# Patient Record
Sex: Female | Born: 1950
Health system: Southern US, Community
[De-identification: ages and names within clinical notes are randomized; demographics above are authoritative.]

## PROBLEM LIST (undated history)

## (undated) DIAGNOSIS — I499 Cardiac arrhythmia, unspecified: Secondary | ICD-10-CM

## (undated) DIAGNOSIS — Z87442 Personal history of urinary calculi: Secondary | ICD-10-CM

## (undated) DIAGNOSIS — I4891 Unspecified atrial fibrillation: Secondary | ICD-10-CM

## (undated) DIAGNOSIS — C50919 Malignant neoplasm of unspecified site of unspecified female breast: Secondary | ICD-10-CM

## (undated) DIAGNOSIS — Z9221 Personal history of antineoplastic chemotherapy: Secondary | ICD-10-CM

## (undated) DIAGNOSIS — K429 Umbilical hernia without obstruction or gangrene: Secondary | ICD-10-CM

## (undated) HISTORY — PX: OTHER SURGICAL HISTORY: SHX169

## (undated) HISTORY — PX: OVARIAN CYST REMOVAL: SHX89

## (undated) HISTORY — DX: Unspecified atrial fibrillation: I48.91

---

## 1998-05-09 ENCOUNTER — Other Ambulatory Visit: Admission: RE | Admit: 1998-05-09 | Discharge: 1998-05-09 | Payer: Self-pay | Admitting: Obstetrics and Gynecology

## 1998-07-19 DIAGNOSIS — Z9221 Personal history of antineoplastic chemotherapy: Secondary | ICD-10-CM

## 1998-07-19 DIAGNOSIS — C50919 Malignant neoplasm of unspecified site of unspecified female breast: Secondary | ICD-10-CM

## 1998-07-19 HISTORY — DX: Malignant neoplasm of unspecified site of unspecified female breast: C50.919

## 1998-07-19 HISTORY — DX: Personal history of antineoplastic chemotherapy: Z92.21

## 1998-07-19 HISTORY — PX: MASTECTOMY: SHX3

## 1999-05-11 ENCOUNTER — Encounter (INDEPENDENT_AMBULATORY_CARE_PROVIDER_SITE_OTHER): Payer: Self-pay | Admitting: Specialist

## 1999-05-11 ENCOUNTER — Other Ambulatory Visit: Admission: RE | Admit: 1999-05-11 | Discharge: 1999-05-11 | Payer: Self-pay | Admitting: Obstetrics and Gynecology

## 1999-06-25 ENCOUNTER — Encounter: Payer: Self-pay | Admitting: Surgery

## 1999-06-25 ENCOUNTER — Encounter (INDEPENDENT_AMBULATORY_CARE_PROVIDER_SITE_OTHER): Payer: Self-pay

## 1999-06-25 ENCOUNTER — Ambulatory Visit (HOSPITAL_COMMUNITY): Admission: RE | Admit: 1999-06-25 | Discharge: 1999-06-25 | Payer: Self-pay | Admitting: Surgery

## 1999-07-10 ENCOUNTER — Encounter: Payer: Self-pay | Admitting: Obstetrics and Gynecology

## 1999-07-10 ENCOUNTER — Encounter: Admission: RE | Admit: 1999-07-10 | Discharge: 1999-07-10 | Payer: Self-pay | Admitting: Obstetrics and Gynecology

## 1999-07-21 ENCOUNTER — Encounter: Admission: RE | Admit: 1999-07-21 | Discharge: 1999-07-21 | Payer: Self-pay | Admitting: Obstetrics and Gynecology

## 1999-07-21 ENCOUNTER — Encounter: Payer: Self-pay | Admitting: Obstetrics and Gynecology

## 1999-10-14 ENCOUNTER — Encounter: Payer: Self-pay | Admitting: Surgery

## 1999-10-14 ENCOUNTER — Ambulatory Visit (HOSPITAL_COMMUNITY): Admission: RE | Admit: 1999-10-14 | Discharge: 1999-10-14 | Payer: Self-pay | Admitting: Surgery

## 2000-02-15 ENCOUNTER — Ambulatory Visit (HOSPITAL_COMMUNITY): Admission: RE | Admit: 2000-02-15 | Discharge: 2000-02-15 | Payer: Self-pay

## 2000-03-03 ENCOUNTER — Encounter (INDEPENDENT_AMBULATORY_CARE_PROVIDER_SITE_OTHER): Payer: Self-pay

## 2000-03-03 ENCOUNTER — Other Ambulatory Visit: Admission: RE | Admit: 2000-03-03 | Discharge: 2000-03-03 | Payer: Self-pay | Admitting: Obstetrics and Gynecology

## 2000-06-13 ENCOUNTER — Ambulatory Visit (HOSPITAL_COMMUNITY): Admission: RE | Admit: 2000-06-13 | Discharge: 2000-06-13 | Payer: Self-pay | Admitting: Obstetrics and Gynecology

## 2000-06-13 ENCOUNTER — Encounter: Payer: Self-pay | Admitting: Obstetrics and Gynecology

## 2000-09-07 ENCOUNTER — Encounter (INDEPENDENT_AMBULATORY_CARE_PROVIDER_SITE_OTHER): Payer: Self-pay

## 2000-09-07 ENCOUNTER — Other Ambulatory Visit: Admission: RE | Admit: 2000-09-07 | Discharge: 2000-09-07 | Payer: Self-pay | Admitting: Obstetrics and Gynecology

## 2002-06-11 ENCOUNTER — Ambulatory Visit (HOSPITAL_COMMUNITY): Admission: RE | Admit: 2002-06-11 | Discharge: 2002-06-11 | Payer: Self-pay | Admitting: Obstetrics and Gynecology

## 2002-06-11 ENCOUNTER — Encounter: Payer: Self-pay | Admitting: Obstetrics and Gynecology

## 2002-08-30 ENCOUNTER — Ambulatory Visit (HOSPITAL_COMMUNITY): Admission: RE | Admit: 2002-08-30 | Discharge: 2002-08-30 | Payer: Self-pay | Admitting: Obstetrics and Gynecology

## 2002-08-30 ENCOUNTER — Encounter: Payer: Self-pay | Admitting: Obstetrics and Gynecology

## 2004-06-29 ENCOUNTER — Other Ambulatory Visit: Admission: RE | Admit: 2004-06-29 | Discharge: 2004-06-29 | Payer: Self-pay | Admitting: Obstetrics and Gynecology

## 2005-08-09 ENCOUNTER — Other Ambulatory Visit: Admission: RE | Admit: 2005-08-09 | Discharge: 2005-08-09 | Payer: Self-pay | Admitting: Obstetrics and Gynecology

## 2005-08-12 ENCOUNTER — Ambulatory Visit (HOSPITAL_COMMUNITY): Admission: RE | Admit: 2005-08-12 | Discharge: 2005-08-12 | Payer: Self-pay | Admitting: Obstetrics and Gynecology

## 2006-08-10 ENCOUNTER — Other Ambulatory Visit: Admission: RE | Admit: 2006-08-10 | Discharge: 2006-08-10 | Payer: Self-pay | Admitting: Obstetrics and Gynecology

## 2006-11-16 ENCOUNTER — Encounter: Admission: RE | Admit: 2006-11-16 | Discharge: 2006-11-16 | Payer: Self-pay | Admitting: Obstetrics and Gynecology

## 2007-08-14 ENCOUNTER — Other Ambulatory Visit: Admission: RE | Admit: 2007-08-14 | Discharge: 2007-08-14 | Payer: Self-pay | Admitting: Obstetrics and Gynecology

## 2008-05-06 ENCOUNTER — Ambulatory Visit: Payer: Self-pay | Admitting: *Deleted

## 2008-05-06 ENCOUNTER — Ambulatory Visit: Admission: RE | Admit: 2008-05-06 | Discharge: 2008-05-06 | Payer: Self-pay | Admitting: Family Medicine

## 2008-05-06 ENCOUNTER — Encounter (INDEPENDENT_AMBULATORY_CARE_PROVIDER_SITE_OTHER): Payer: Self-pay | Admitting: Family Medicine

## 2008-08-15 ENCOUNTER — Other Ambulatory Visit: Admission: RE | Admit: 2008-08-15 | Discharge: 2008-08-15 | Payer: Self-pay | Admitting: Obstetrics and Gynecology

## 2009-08-18 ENCOUNTER — Other Ambulatory Visit: Admission: RE | Admit: 2009-08-18 | Discharge: 2009-08-18 | Payer: Self-pay | Admitting: Obstetrics and Gynecology

## 2010-08-18 ENCOUNTER — Other Ambulatory Visit (HOSPITAL_COMMUNITY)
Admission: RE | Admit: 2010-08-18 | Discharge: 2010-08-18 | Disposition: A | Discharge status: Home / Self Care | Payer: 59 | Source: Ambulatory Visit | Attending: Obstetrics and Gynecology | Admitting: Obstetrics and Gynecology

## 2010-08-18 ENCOUNTER — Other Ambulatory Visit: Payer: Self-pay | Admitting: Nurse Practitioner

## 2010-08-18 DIAGNOSIS — Z1159 Encounter for screening for other viral diseases: Secondary | ICD-10-CM | POA: Insufficient documentation

## 2010-08-18 DIAGNOSIS — Z01419 Encounter for gynecological examination (general) (routine) without abnormal findings: Secondary | ICD-10-CM | POA: Insufficient documentation

## 2012-07-19 HISTORY — PX: BREAST BIOPSY: SHX20

## 2012-08-17 ENCOUNTER — Other Ambulatory Visit: Payer: Self-pay | Admitting: Obstetrics and Gynecology

## 2012-08-17 DIAGNOSIS — Z1231 Encounter for screening mammogram for malignant neoplasm of breast: Secondary | ICD-10-CM

## 2012-08-17 DIAGNOSIS — Z9012 Acquired absence of left breast and nipple: Secondary | ICD-10-CM

## 2012-08-17 DIAGNOSIS — Z853 Personal history of malignant neoplasm of breast: Secondary | ICD-10-CM

## 2012-09-19 ENCOUNTER — Ambulatory Visit: Payer: 59

## 2012-10-11 ENCOUNTER — Ambulatory Visit: Payer: 59

## 2012-11-07 ENCOUNTER — Ambulatory Visit
Admission: RE | Admit: 2012-11-07 | Discharge: 2012-11-07 | Disposition: A | Discharge status: Home / Self Care | Payer: 59 | Source: Ambulatory Visit | Attending: Obstetrics and Gynecology | Admitting: Obstetrics and Gynecology

## 2012-11-07 ENCOUNTER — Other Ambulatory Visit: Payer: Self-pay

## 2012-11-07 DIAGNOSIS — Z9012 Acquired absence of left breast and nipple: Secondary | ICD-10-CM

## 2012-11-07 DIAGNOSIS — Z1231 Encounter for screening mammogram for malignant neoplasm of breast: Secondary | ICD-10-CM

## 2012-11-07 DIAGNOSIS — Z853 Personal history of malignant neoplasm of breast: Secondary | ICD-10-CM

## 2012-11-08 ENCOUNTER — Other Ambulatory Visit: Payer: Self-pay | Admitting: Nurse Practitioner

## 2012-11-08 DIAGNOSIS — R928 Other abnormal and inconclusive findings on diagnostic imaging of breast: Secondary | ICD-10-CM

## 2012-11-10 ENCOUNTER — Ambulatory Visit
Admission: RE | Admit: 2012-11-10 | Discharge: 2012-11-10 | Disposition: A | Discharge status: Home / Self Care | Payer: 59 | Source: Ambulatory Visit | Attending: Nurse Practitioner | Admitting: Nurse Practitioner

## 2012-11-10 ENCOUNTER — Other Ambulatory Visit: Payer: Self-pay | Admitting: Nurse Practitioner

## 2012-11-10 DIAGNOSIS — R928 Other abnormal and inconclusive findings on diagnostic imaging of breast: Secondary | ICD-10-CM

## 2012-11-13 ENCOUNTER — Ambulatory Visit
Admission: RE | Admit: 2012-11-13 | Discharge: 2012-11-13 | Disposition: A | Discharge status: Home / Self Care | Payer: 59 | Source: Ambulatory Visit | Attending: Nurse Practitioner | Admitting: Nurse Practitioner

## 2012-11-13 ENCOUNTER — Other Ambulatory Visit (HOSPITAL_COMMUNITY): Payer: Self-pay | Admitting: Diagnostic Radiology

## 2012-11-13 DIAGNOSIS — R928 Other abnormal and inconclusive findings on diagnostic imaging of breast: Secondary | ICD-10-CM

## 2012-11-14 ENCOUNTER — Other Ambulatory Visit: Payer: Self-pay | Admitting: Nurse Practitioner

## 2012-11-14 ENCOUNTER — Other Ambulatory Visit (HOSPITAL_COMMUNITY)
Admission: RE | Admit: 2012-11-14 | Discharge: 2012-11-14 | Disposition: A | Discharge status: Home / Self Care | Payer: 59 | Source: Ambulatory Visit | Attending: Obstetrics and Gynecology | Admitting: Obstetrics and Gynecology

## 2012-11-14 DIAGNOSIS — Z1151 Encounter for screening for human papillomavirus (HPV): Secondary | ICD-10-CM | POA: Insufficient documentation

## 2012-11-14 DIAGNOSIS — Z01419 Encounter for gynecological examination (general) (routine) without abnormal findings: Secondary | ICD-10-CM | POA: Insufficient documentation

## 2013-11-07 ENCOUNTER — Other Ambulatory Visit: Payer: Self-pay

## 2013-11-07 DIAGNOSIS — Z9012 Acquired absence of left breast and nipple: Secondary | ICD-10-CM

## 2013-11-07 DIAGNOSIS — Z1231 Encounter for screening mammogram for malignant neoplasm of breast: Secondary | ICD-10-CM

## 2013-11-08 ENCOUNTER — Encounter (INDEPENDENT_AMBULATORY_CARE_PROVIDER_SITE_OTHER): Payer: Self-pay

## 2013-11-08 ENCOUNTER — Ambulatory Visit
Admission: RE | Admit: 2013-11-08 | Discharge: 2013-11-08 | Disposition: A | Discharge status: Home / Self Care | Payer: Self-pay | Source: Ambulatory Visit

## 2013-11-08 DIAGNOSIS — Z1231 Encounter for screening mammogram for malignant neoplasm of breast: Secondary | ICD-10-CM

## 2013-11-08 DIAGNOSIS — Z9012 Acquired absence of left breast and nipple: Secondary | ICD-10-CM

## 2013-11-12 ENCOUNTER — Ambulatory Visit: Payer: 59

## 2014-11-08 ENCOUNTER — Other Ambulatory Visit: Payer: Self-pay | Admitting: Nurse Practitioner

## 2014-11-08 ENCOUNTER — Other Ambulatory Visit: Payer: Self-pay

## 2014-11-08 DIAGNOSIS — Z9012 Acquired absence of left breast and nipple: Secondary | ICD-10-CM

## 2014-11-08 DIAGNOSIS — Z1231 Encounter for screening mammogram for malignant neoplasm of breast: Secondary | ICD-10-CM

## 2014-11-19 ENCOUNTER — Encounter (INDEPENDENT_AMBULATORY_CARE_PROVIDER_SITE_OTHER): Payer: Self-pay

## 2014-11-19 ENCOUNTER — Ambulatory Visit
Admission: RE | Admit: 2014-11-19 | Discharge: 2014-11-19 | Disposition: A | Discharge status: Home / Self Care | Payer: 59 | Source: Ambulatory Visit

## 2014-11-19 DIAGNOSIS — Z9012 Acquired absence of left breast and nipple: Secondary | ICD-10-CM

## 2014-11-19 DIAGNOSIS — Z1231 Encounter for screening mammogram for malignant neoplasm of breast: Secondary | ICD-10-CM

## 2015-10-17 ENCOUNTER — Other Ambulatory Visit: Payer: Self-pay

## 2015-10-17 DIAGNOSIS — Z1231 Encounter for screening mammogram for malignant neoplasm of breast: Secondary | ICD-10-CM

## 2015-11-24 ENCOUNTER — Ambulatory Visit
Admission: RE | Admit: 2015-11-24 | Discharge: 2015-11-24 | Disposition: A | Discharge status: Home / Self Care | Payer: 59 | Source: Ambulatory Visit

## 2015-11-24 DIAGNOSIS — Z1231 Encounter for screening mammogram for malignant neoplasm of breast: Secondary | ICD-10-CM

## 2015-11-25 ENCOUNTER — Other Ambulatory Visit: Payer: Self-pay | Admitting: Nurse Practitioner

## 2015-11-25 ENCOUNTER — Other Ambulatory Visit (HOSPITAL_COMMUNITY)
Admission: RE | Admit: 2015-11-25 | Discharge: 2015-11-25 | Disposition: A | Discharge status: Home / Self Care | Payer: 59 | Source: Ambulatory Visit | Attending: Nurse Practitioner | Admitting: Nurse Practitioner

## 2015-11-25 DIAGNOSIS — Z01419 Encounter for gynecological examination (general) (routine) without abnormal findings: Secondary | ICD-10-CM | POA: Diagnosis present

## 2015-11-25 DIAGNOSIS — Z1151 Encounter for screening for human papillomavirus (HPV): Secondary | ICD-10-CM | POA: Insufficient documentation

## 2015-11-27 LAB — CYTOLOGY - PAP

## 2016-07-19 HISTORY — PX: BREAST BIOPSY: SHX20

## 2016-11-03 ENCOUNTER — Other Ambulatory Visit: Payer: Self-pay | Admitting: Nurse Practitioner

## 2016-11-03 DIAGNOSIS — Z1231 Encounter for screening mammogram for malignant neoplasm of breast: Secondary | ICD-10-CM

## 2016-11-24 ENCOUNTER — Ambulatory Visit
Admission: RE | Admit: 2016-11-24 | Discharge: 2016-11-24 | Disposition: A | Discharge status: Home / Self Care | Payer: Medicare Other | Source: Ambulatory Visit | Attending: Nurse Practitioner | Admitting: Nurse Practitioner

## 2016-11-24 DIAGNOSIS — Z1231 Encounter for screening mammogram for malignant neoplasm of breast: Secondary | ICD-10-CM

## 2016-11-25 ENCOUNTER — Other Ambulatory Visit: Payer: Self-pay | Admitting: Nurse Practitioner

## 2016-11-25 DIAGNOSIS — R928 Other abnormal and inconclusive findings on diagnostic imaging of breast: Secondary | ICD-10-CM

## 2016-11-29 ENCOUNTER — Other Ambulatory Visit: Payer: Self-pay | Admitting: Nurse Practitioner

## 2016-11-29 ENCOUNTER — Ambulatory Visit
Admission: RE | Admit: 2016-11-29 | Discharge: 2016-11-29 | Disposition: A | Discharge status: Home / Self Care | Payer: Medicare Other | Source: Ambulatory Visit | Attending: Nurse Practitioner | Admitting: Nurse Practitioner

## 2016-11-29 ENCOUNTER — Encounter: Payer: Self-pay | Admitting: Radiology

## 2016-11-29 DIAGNOSIS — N6310 Unspecified lump in the right breast, unspecified quadrant: Secondary | ICD-10-CM

## 2016-11-29 DIAGNOSIS — R928 Other abnormal and inconclusive findings on diagnostic imaging of breast: Secondary | ICD-10-CM

## 2016-11-30 ENCOUNTER — Other Ambulatory Visit: Payer: Self-pay | Admitting: Nurse Practitioner

## 2016-11-30 ENCOUNTER — Other Ambulatory Visit: Payer: Medicare Other

## 2016-11-30 ENCOUNTER — Ambulatory Visit
Admission: RE | Admit: 2016-11-30 | Discharge: 2016-11-30 | Disposition: A | Discharge status: Home / Self Care | Payer: Medicare Other | Source: Ambulatory Visit | Attending: Nurse Practitioner | Admitting: Nurse Practitioner

## 2016-11-30 DIAGNOSIS — N6310 Unspecified lump in the right breast, unspecified quadrant: Secondary | ICD-10-CM

## 2017-04-15 ENCOUNTER — Other Ambulatory Visit: Payer: Self-pay | Admitting: Nurse Practitioner

## 2017-04-15 DIAGNOSIS — D179 Benign lipomatous neoplasm, unspecified: Secondary | ICD-10-CM

## 2017-06-01 ENCOUNTER — Ambulatory Visit
Admission: RE | Admit: 2017-06-01 | Discharge: 2017-06-01 | Disposition: A | Discharge status: Home / Self Care | Payer: Medicare Other | Source: Ambulatory Visit | Attending: Nurse Practitioner | Admitting: Nurse Practitioner

## 2017-06-01 ENCOUNTER — Ambulatory Visit: Payer: Medicare Other

## 2017-06-01 DIAGNOSIS — D179 Benign lipomatous neoplasm, unspecified: Secondary | ICD-10-CM

## 2017-06-01 HISTORY — DX: Malignant neoplasm of unspecified site of unspecified female breast: C50.919

## 2017-06-01 HISTORY — DX: Personal history of antineoplastic chemotherapy: Z92.21

## 2017-08-18 ENCOUNTER — Other Ambulatory Visit: Payer: Self-pay | Admitting: Urology

## 2017-08-22 ENCOUNTER — Other Ambulatory Visit: Payer: Self-pay

## 2017-08-22 ENCOUNTER — Encounter (HOSPITAL_COMMUNITY): Payer: Self-pay | Admitting: *Deleted

## 2017-08-24 NOTE — H&P (Addendum)
CC: Non-obstructing stone follow-up  HPI: Nancy Ibarra is a 67 year-old female established patient who is here today for interval evaluation of non-obstructing kidney stones.  The patient was last seen one prior.   The patient has not passed any stones since they were last seen. She has had any flank pain since in the interval. The patient states the pain occurs seldom and occasionally. The patient had right renal pelvis.   The patient denies any progressive voiding symptoms. She denies dysuria. She does not have hematuria. She has not had fever and chills.   She has not had kidney stone surgery.   The patient has not completed a 24 hour urine collection in the past.   The patient underwent CT scan prior to today's appointment.   Patient is here today to follow-up a pulmonary nodule that was incidentally noted on CT scan for her hematuria evaluation.   CT Chest - 10/2015- Well-circumscribed 7x54mm ovoid nodule in right middle lobe, likely corresponding to a 5mm nodule in 2003, favored to be benign given extremely slow growth.   Chest CT-11/18: Right middle lobe pulmonary nodule 77 mm, unchanged   The patient describes no pain in the interval since she was last seen. No fevers, no hematuria, no dysuria. She describes no cough or shortness of breath.     ALLERGIES: No Allergies    MEDICATIONS: Co Q10  Fish Oil  No Reported Medications  Probiotic  Vitamin D     GU PSH: None     PSH Notes: Ovarian Surgery, Breast Surgery Mastectomy   NON-GU PSH: None   GU PMH: Renal calculus, Nephrolithiasis - 2017 Gross hematuria, Gross hematuria - 2017    NON-GU PMH: Solitary pulmonary nodule, The patient's CT scan today demonstrates this stability of her right lower lobe pulmonary nodule measuring 7 mm. - 05/24/2016 Personal history of other specified conditions, History of solitary pulmonary nodule - 2017 Breast Cancer, History, History of breast cancer - 2017 Encounter for general adult  medical examination without abnormal findings, Encounter for preventive health examination - 2017    FAMILY HISTORY: Bladder Cancer - Brother, Mother malignant neoplasm of urinary bladder - Runs In Family   SOCIAL HISTORY: Marital Status: Married Preferred Language: English; Ethnicity: Not Hispanic Or Latino; Race: White Current Smoking Status: Patient has never smoked.  Has never drank.  Does not drink caffeine. Has not had a blood transfusion. Patient's occupation is/was retired.     Notes: Occupation, Married, Alcohol use, Number of children, Never a smoker, Caffeine use   REVIEW OF SYSTEMS:    GU Review Female:   Patient denies frequent urination, hard to postpone urination, burning /pain with urination, get up at night to urinate, leakage of urine, stream starts and stops, trouble starting your stream, have to strain to urinate, and being pregnant.  Gastrointestinal (Upper):   Patient denies nausea, vomiting, and indigestion/ heartburn.  Gastrointestinal (Lower):   Patient denies diarrhea and constipation.  Constitutional:   Patient denies fever, night sweats, weight loss, and fatigue.  Skin:   Patient denies itching and skin rash/ lesion.  Eyes:   Patient denies blurred vision and double vision.  Ears/ Nose/ Throat:   Patient denies sore throat and sinus problems.  Hematologic/Lymphatic:   Patient denies swollen glands and easy bruising.  Cardiovascular:   Patient denies leg swelling and chest pains.  Respiratory:   Patient denies cough and shortness of breath.  Endocrine:   Patient denies excessive thirst.  Musculoskeletal:   Patient  denies back pain and joint pain.  Neurological:   Patient denies headaches and dizziness.  Psychologic:   Patient denies depression and anxiety.   VITAL SIGNS:      06/16/2017 02:45 PM  Weight 170 lb / 77.11 kg  BP 161/80 mmHg  Pulse 64 /min   MULTI-SYSTEM PHYSICAL EXAMINATION:    Constitutional: Obese. No physical deformities. Normally  developed. Good grooming.   Respiratory: No labored breathing, no use of accessory muscles. Clear to auscultation bilaterally  Cardiovascular: Normal temperature, normal extremity pulses, no swelling, no varicosities. Regular rate and rhythm     PAST DATA REVIEWED:  Source Of History:  Patient  X-Ray Review: C.T. Chest: Reviewed Films. Discussed With Patient.     PROCEDURES:          Urinalysis w/Scope - 81001 Dipstick Dipstick Cont'd Micro  Color: Yellow Bilirubin: Neg WBC/hpf: 0 - 5/hpf  Appearance: Clear Ketones: Neg RBC/hpf: 3 - 10/hpf  Specific Gravity: 1.020 Blood: 1+ Bacteria: NS (Not Seen)  pH: 6.5 Protein: Trace Cystals: NS (Not Seen)  Glucose: Neg Urobilinogen: 0.2 Casts: NS (Not Seen)    Nitrites: Neg Trichomonas: Not Present    Leukocyte Esterase: Neg Mucous: Present      Epithelial Cells: 0 - 5/hpf      Yeast: NS (Not Seen)      Sperm: Not Present    Notes:      ASSESSMENT:      ICD-10 Details  1 GU:   Renal calculus - N20.0    PLAN:           Orders Labs Urine Culture          Schedule Return Visit/Planned Activity: Return PRN - Office Visit          Document Letter(s):  Created for Patient: Clinical Summary         Notes:   The patient has a right nonobstructing 12 mm stone in the renal pelvis. She has had some intermittent flank pain over the past year. The stone has grown quite significantly over the past 2 years. I recommended treatment given the stone growth. We discussed 3 primary options including ureteroscopy, shockwave lithotripsy, and PCNL. I discussed all 3 of these options in great detail. Ultimately, the patient has settled on shockwave lithotripsy. She understands the associated risk of obstructing fragments, hematoma, and the need for additional procedures. However, she would like to consider her options and discuss with her husband. She will contact our office if she would like to schedule this.   A CT scan shows that the stone is  visible on the scalp image. The stone measures between 6 and 1100 Hounsfield units. It is definitely shockable.    Urine culture on 08/19/17 was negative.

## 2017-08-25 ENCOUNTER — Ambulatory Visit (HOSPITAL_COMMUNITY): Payer: Medicare Other

## 2017-08-25 ENCOUNTER — Encounter (HOSPITAL_COMMUNITY)
Admission: RE | Disposition: A | Discharge status: Home / Self Care | Payer: Self-pay | Source: Ambulatory Visit | Attending: Urology

## 2017-08-25 ENCOUNTER — Ambulatory Visit (HOSPITAL_COMMUNITY)
Admission: RE | Admit: 2017-08-25 | Discharge: 2017-08-25 | Disposition: A | Discharge status: Home / Self Care | Payer: Medicare Other | Source: Ambulatory Visit | Attending: Urology | Admitting: Urology

## 2017-08-25 ENCOUNTER — Encounter (HOSPITAL_COMMUNITY): Payer: Self-pay | Admitting: General Practice

## 2017-08-25 DIAGNOSIS — R911 Solitary pulmonary nodule: Secondary | ICD-10-CM | POA: Diagnosis not present

## 2017-08-25 DIAGNOSIS — Z853 Personal history of malignant neoplasm of breast: Secondary | ICD-10-CM | POA: Insufficient documentation

## 2017-08-25 DIAGNOSIS — N200 Calculus of kidney: Secondary | ICD-10-CM | POA: Diagnosis present

## 2017-08-25 DIAGNOSIS — Z8052 Family history of malignant neoplasm of bladder: Secondary | ICD-10-CM | POA: Diagnosis not present

## 2017-08-25 DIAGNOSIS — Z79899 Other long term (current) drug therapy: Secondary | ICD-10-CM | POA: Insufficient documentation

## 2017-08-25 HISTORY — DX: Umbilical hernia without obstruction or gangrene: K42.9

## 2017-08-25 HISTORY — DX: Personal history of urinary calculi: Z87.442

## 2017-08-25 HISTORY — PX: EXTRACORPOREAL SHOCK WAVE LITHOTRIPSY: SHX1557

## 2017-08-25 SURGERY — LITHOTRIPSY, ESWL
Anesthesia: LOCAL | Laterality: Right

## 2017-08-25 MED ORDER — OXYCODONE HCL 5 MG PO TABS: 5.0000 mg | ORAL_TABLET | ORAL | Status: DC | PRN

## 2017-08-25 MED ORDER — CIPROFLOXACIN HCL 500 MG PO TABS
500.0000 mg | ORAL_TABLET | ORAL | Status: AC
  Administered 2017-08-25: 500 mg via ORAL
  Filled 2017-08-25: qty 1

## 2017-08-25 MED ORDER — SODIUM CHLORIDE 0.9% FLUSH: 3.0000 mL | Freq: Two times a day (BID) | INTRAVENOUS | Status: DC

## 2017-08-25 MED ORDER — ACETAMINOPHEN 650 MG RE SUPP
650.0000 mg | RECTAL | Status: DC | PRN
  Filled 2017-08-25: qty 1

## 2017-08-25 MED ORDER — SODIUM CHLORIDE 0.9 % IV SOLN
INTRAVENOUS | Status: DC
  Administered 2017-08-25: 08:00:00 via INTRAVENOUS

## 2017-08-25 MED ORDER — DIAZEPAM 5 MG PO TABS
10.0000 mg | ORAL_TABLET | ORAL | Status: AC
  Administered 2017-08-25: 10 mg via ORAL
  Filled 2017-08-25: qty 2

## 2017-08-25 MED ORDER — MORPHINE SULFATE (PF) 4 MG/ML IV SOLN: 2.0000 mg | INTRAVENOUS | Status: DC | PRN

## 2017-08-25 MED ORDER — HYDROCODONE-ACETAMINOPHEN 5-325 MG PO TABS: 1.0000 | ORAL_TABLET | ORAL | 0 refills | Status: DC | PRN

## 2017-08-25 MED ORDER — DIPHENHYDRAMINE HCL 25 MG PO CAPS
25.0000 mg | ORAL_CAPSULE | ORAL | Status: AC
  Administered 2017-08-25: 25 mg via ORAL
  Filled 2017-08-25: qty 1

## 2017-08-25 MED ORDER — SODIUM CHLORIDE 0.9% FLUSH: 3.0000 mL | INTRAVENOUS | Status: DC | PRN

## 2017-08-25 MED ORDER — ACETAMINOPHEN 325 MG PO TABS: 650.0000 mg | ORAL_TABLET | ORAL | Status: DC | PRN

## 2017-08-25 MED ORDER — ONDANSETRON HCL 4 MG PO TABS: 4.0000 mg | ORAL_TABLET | Freq: Four times a day (QID) | ORAL | 1 refills | Status: DC | PRN

## 2017-08-25 MED ORDER — SODIUM CHLORIDE 0.9 % IV SOLN: 250.0000 mL | INTRAVENOUS | Status: DC | PRN

## 2017-08-25 NOTE — Discharge Instructions (Signed)
Lithotripsy, Care After °This sheet gives you information about how to care for yourself after your procedure. Your health care provider may also give you more specific instructions. If you have problems or questions, contact your health care provider. °What can I expect after the procedure? °After the procedure, it is common to have: °· Some blood in your urine. This should only last for a few days. °· Soreness in your back, sides, or upper abdomen for a few days. °· Blotches or bruises on your back where the pressure wave entered the skin. °· Pain, discomfort, or nausea when pieces (fragments) of the kidney stone move through the tube that carries urine from the kidney to the bladder (ureter). Stone fragments may pass soon after the procedure, but they may continue to pass for up to 4-8 weeks. °? If you have severe pain or nausea, contact your health care provider. This may be caused by a large stone that was not broken up, and this may mean that you need more treatment. °· Some pain or discomfort during urination. °· Some pain or discomfort in the lower abdomen or (in men) at the base of the penis. ° °Follow these instructions at home: °Medicines °· Take over-the-counter and prescription medicines only as told by your health care provider. °· If you were prescribed an antibiotic medicine, take it as told by your health care provider. Do not stop taking the antibiotic even if you start to feel better. °· Do not drive for 24 hours if you were given a medicine to help you relax (sedative). °· Do not drive or use heavy machinery while taking prescription pain medicine. °Eating and drinking °· Drink enough water and fluids to keep your urine clear or pale yellow. This helps any remaining pieces of the stone to pass. It can also help prevent new stones from forming. °· Eat plenty of fresh fruits and vegetables. °· Follow instructions from your health care provider about eating and drinking restrictions. You may be  instructed: °? To reduce how much salt (sodium) you eat or drink. Check ingredients and nutrition facts on packaged foods and beverages. °? To reduce how much meat you eat. °· Eat the recommended amount of calcium for your age and gender. Ask your health care provider how much calcium you should have. °General instructions °· Get plenty of rest. °· Most people can resume normal activities 1-2 days after the procedure. Ask your health care provider what activities are safe for you. °· If directed, strain all urine through the strainer that was provided by your health care provider. °? Keep all fragments for your health care provider to see. Any stones that are found may be sent to a medical lab for examination. The stone may be as small as a grain of salt. °· Keep all follow-up visits as told by your health care provider. This is important. °Contact a health care provider if: °· You have pain that is severe or does not get better with medicine. °· You have nausea that is severe or does not go away. °· You have blood in your urine longer than your health care provider told you to expect. °· You have more blood in your urine. °· You have pain during urination that does not go away. °· You urinate more frequently than usual and this does not go away. °· You develop a rash or any other possible signs of an allergic reaction. °Get help right away if: °· You have severe pain in   your back, sides, or upper abdomen.  You have severe pain while urinating.  Your urine is very dark red.  You have blood in your stool (feces).  You cannot pass any urine at all.  You feel a strong urge to urinate after emptying your bladder.  You have a fever or chills.  You develop shortness of breath, difficulty breathing, or chest pain.  You have severe nausea that leads to persistent vomiting.  You faint. Summary  After this procedure, it is common to have some pain, discomfort, or nausea when pieces (fragments) of the  kidney stone move through the tube that carries urine from the kidney to the bladder (ureter). If this pain or nausea is severe, however, you should contact your health care provider.  Most people can resume normal activities 1-2 days after the procedure. Ask your health care provider what activities are safe for you.  Drink enough water and fluids to keep your urine clear or pale yellow. This helps any remaining pieces of the stone to pass, and it can help prevent new stones from forming.  If directed, strain your urine and keep all fragments for your health care provider to see. Fragments or stones may be as small as a grain of salt.  Get help right away if you have severe pain in your back, sides, or upper abdomen or have severe pain while urinating. This information is not intended to replace advice given to you by your health care provider. Make sure you discuss any questions you have with your health care provider.  Your pain and nausea med prescriptions were sent to the pharmacy.  Document Released: 07/25/2007 Document Revised: 05/26/2016 Document Reviewed: 05/26/2016 Elsevier Interactive Patient Education  [employer].

## 2017-08-25 NOTE — Interval H&P Note (Signed)
History and Physical Interval Note:  No change in stone.   08/25/2017 8:54 AM  [name]  has presented today for surgery, with the diagnosis of RIGHT URETEROPELVIC JUNCTION STONE  The various methods of treatment have been discussed with the patient and family. After consideration of risks, benefits and other options for treatment, the patient has consented to  Procedure(s): RIGHT EXTRACORPOREAL SHOCK WAVE LITHOTRIPSY (ESWL) (Right) as a surgical intervention .  The patient's history has been reviewed, patient examined, no change in status, stable for surgery.  I have reviewed the patient's chart and labs.  Questions were answered to the patient's satisfaction.     Nancy Ibarra

## 2017-08-26 ENCOUNTER — Encounter (HOSPITAL_COMMUNITY): Payer: Self-pay | Admitting: Urology

## 2018-05-12 ENCOUNTER — Other Ambulatory Visit: Payer: Self-pay | Admitting: Nurse Practitioner

## 2018-05-12 DIAGNOSIS — Z1231 Encounter for screening mammogram for malignant neoplasm of breast: Secondary | ICD-10-CM

## 2018-06-20 ENCOUNTER — Ambulatory Visit
Admission: RE | Admit: 2018-06-20 | Discharge: 2018-06-20 | Disposition: A | Discharge status: Home / Self Care | Payer: Medicare Other | Source: Ambulatory Visit | Attending: Nurse Practitioner | Admitting: Nurse Practitioner

## 2018-06-20 DIAGNOSIS — Z1231 Encounter for screening mammogram for malignant neoplasm of breast: Secondary | ICD-10-CM

## 2019-05-18 ENCOUNTER — Other Ambulatory Visit: Payer: Self-pay | Admitting: Nurse Practitioner

## 2019-05-18 DIAGNOSIS — Z1231 Encounter for screening mammogram for malignant neoplasm of breast: Secondary | ICD-10-CM

## 2019-07-04 ENCOUNTER — Ambulatory Visit
Admission: RE | Admit: 2019-07-04 | Discharge: 2019-07-04 | Disposition: A | Discharge status: Home / Self Care | Payer: Medicare Other | Source: Ambulatory Visit | Attending: Nurse Practitioner | Admitting: Nurse Practitioner

## 2019-07-04 ENCOUNTER — Other Ambulatory Visit: Payer: Self-pay

## 2019-07-04 DIAGNOSIS — Z1231 Encounter for screening mammogram for malignant neoplasm of breast: Secondary | ICD-10-CM

## 2020-05-21 ENCOUNTER — Other Ambulatory Visit: Payer: Self-pay | Admitting: Family Medicine

## 2020-05-21 DIAGNOSIS — Z1231 Encounter for screening mammogram for malignant neoplasm of breast: Secondary | ICD-10-CM

## 2020-07-07 ENCOUNTER — Ambulatory Visit
Admission: RE | Admit: 2020-07-07 | Discharge: 2020-07-07 | Disposition: A | Discharge status: Home / Self Care | Payer: Medicare Other | Source: Ambulatory Visit | Attending: Family Medicine | Admitting: Family Medicine

## 2020-07-07 ENCOUNTER — Other Ambulatory Visit: Payer: Self-pay

## 2020-07-07 DIAGNOSIS — Z1231 Encounter for screening mammogram for malignant neoplasm of breast: Secondary | ICD-10-CM

## 2020-12-24 ENCOUNTER — Ambulatory Visit (INDEPENDENT_AMBULATORY_CARE_PROVIDER_SITE_OTHER): Payer: Medicare Other

## 2020-12-24 ENCOUNTER — Ambulatory Visit: Payer: Medicare Other | Admitting: Podiatry

## 2020-12-24 ENCOUNTER — Other Ambulatory Visit: Payer: Self-pay

## 2020-12-24 DIAGNOSIS — B070 Plantar wart: Secondary | ICD-10-CM | POA: Diagnosis not present

## 2020-12-24 DIAGNOSIS — M722 Plantar fascial fibromatosis: Secondary | ICD-10-CM

## 2020-12-24 MED ORDER — MELOXICAM 15 MG PO TABS: 15.0000 mg | ORAL_TABLET | Freq: Every day | ORAL | 1 refills | Status: DC

## 2020-12-24 MED ORDER — METHYLPREDNISOLONE 4 MG PO TBPK: ORAL_TABLET | ORAL | 0 refills | Status: DC

## 2020-12-24 MED ORDER — BETAMETHASONE SOD PHOS & ACET 6 (3-3) MG/ML IJ SUSP
3.0000 mg | Freq: Once | INTRAMUSCULAR | Status: AC
  Administered 2020-12-24: 11:00:00 3 mg via INTRA_ARTICULAR

## 2020-12-24 NOTE — Progress Notes (Signed)
   Subjective: 70 y.o. female presenting as a new patient for evaluation of right heel pain is been going on for few months now.  Patient denies a history of injury.  She does admit to going barefoot throughout the house.  She has not done anything for treatment.   Patient also states that she is developing symptomatic callus lesions to the weightbearing surface of the bilateral feet.  She states that they are very painful to walk on especially without shoes.  Past Medical History:  Diagnosis Date  . Breast cancer ([place]) 2000   left breast   . History of kidney stones   . Personal history of chemotherapy 2000  . Umbilical hernia      Objective: Physical Exam General: The patient is alert and oriented x3 in no acute distress.  Dermatology: Skin is warm, dry and supple bilateral lower extremities. Negative for open lesions or macerations bilateral.  Hyperkeratotic skin lesions noted to the weightbearing surfaces of the bilateral feet with pinpoint bleeding upon debridement  Vascular: Dorsalis Pedis and Posterior Tibial pulses palpable bilateral.  Capillary fill time is immediate to all digits.  Neurological: Epicritic and protective threshold intact bilateral.   Musculoskeletal: Tenderness to palpation to the plantar aspect of the right heel along the plantar fascia. All other joints range of motion within normal limits bilateral. Strength 5/5 in all groups bilateral.   Radiographic exam: Normal osseous mineralization. Joint spaces preserved. No fracture/dislocation/boney destruction. No other soft tissue abnormalities or radiopaque foreign bodies.   Assessment: 1. Plantar fasciitis right 2.  Plantar verruca benign skin lesions bilateral feet  Plan of Care:  1. Patient evaluated. Xrays reviewed.   2. Injection of 0.5cc Celestone soluspan injected into the right plantar fascia  3. Rx for Medrol Dose Pack placed 4. Rx for Meloxicam ordered for patient. 5. Plantar fascial band(s)  dispensed 6. Instructed patient regarding therapies and modalities at home to alleviate symptoms.  7.  Excisional debridement of the verruca lesions was performed using a tissue nipper followed by application of salicylic acid.  Recommend salicylic acid OTC daily  8.  Return to clinic as needed   Edrick Kins, DPM [facility]  Dr. Edrick Kins, DPM    [address] [address], [place] [address]                Office [phone]  Fax [phone]

## 2021-03-18 DIAGNOSIS — Z23 Encounter for immunization: Secondary | ICD-10-CM | POA: Diagnosis not present

## 2021-03-18 DIAGNOSIS — Q6100 Congenital renal cyst, unspecified: Secondary | ICD-10-CM | POA: Diagnosis not present

## 2021-03-18 DIAGNOSIS — Z0000 Encounter for general adult medical examination without abnormal findings: Secondary | ICD-10-CM | POA: Diagnosis not present

## 2021-03-18 DIAGNOSIS — I10 Essential (primary) hypertension: Secondary | ICD-10-CM | POA: Diagnosis not present

## 2021-03-18 DIAGNOSIS — Z853 Personal history of malignant neoplasm of breast: Secondary | ICD-10-CM | POA: Diagnosis not present

## 2021-03-18 DIAGNOSIS — E559 Vitamin D deficiency, unspecified: Secondary | ICD-10-CM | POA: Diagnosis not present

## 2021-03-18 DIAGNOSIS — M5136 Other intervertebral disc degeneration, lumbar region: Secondary | ICD-10-CM | POA: Diagnosis not present

## 2021-03-18 DIAGNOSIS — R911 Solitary pulmonary nodule: Secondary | ICD-10-CM | POA: Diagnosis not present

## 2021-03-18 DIAGNOSIS — N200 Calculus of kidney: Secondary | ICD-10-CM | POA: Diagnosis not present

## 2021-03-18 DIAGNOSIS — R7303 Prediabetes: Secondary | ICD-10-CM | POA: Diagnosis not present

## 2021-03-18 DIAGNOSIS — R011 Cardiac murmur, unspecified: Secondary | ICD-10-CM | POA: Diagnosis not present

## 2021-03-25 ENCOUNTER — Other Ambulatory Visit (HOSPITAL_COMMUNITY): Payer: Self-pay | Admitting: Family Medicine

## 2021-03-25 DIAGNOSIS — R011 Cardiac murmur, unspecified: Secondary | ICD-10-CM

## 2021-04-15 ENCOUNTER — Other Ambulatory Visit: Payer: Self-pay

## 2021-04-15 ENCOUNTER — Ambulatory Visit (HOSPITAL_COMMUNITY): Payer: Medicare Other | Attending: Family Medicine

## 2021-04-15 DIAGNOSIS — R011 Cardiac murmur, unspecified: Secondary | ICD-10-CM | POA: Insufficient documentation

## 2021-04-15 LAB — ECHOCARDIOGRAM COMPLETE
Area-P 1/2: 3.37 cm2
P 1/2 time: 405 msec
S' Lateral: 2.8 cm

## 2021-05-20 DIAGNOSIS — R011 Cardiac murmur, unspecified: Secondary | ICD-10-CM | POA: Insufficient documentation

## 2021-05-20 DIAGNOSIS — C50919 Malignant neoplasm of unspecified site of unspecified female breast: Secondary | ICD-10-CM | POA: Insufficient documentation

## 2021-05-29 DIAGNOSIS — B351 Tinea unguium: Secondary | ICD-10-CM | POA: Diagnosis not present

## 2021-05-29 DIAGNOSIS — D225 Melanocytic nevi of trunk: Secondary | ICD-10-CM | POA: Diagnosis not present

## 2021-05-29 DIAGNOSIS — L814 Other melanin hyperpigmentation: Secondary | ICD-10-CM | POA: Diagnosis not present

## 2021-05-29 DIAGNOSIS — L609 Nail disorder, unspecified: Secondary | ICD-10-CM | POA: Diagnosis not present

## 2021-05-29 DIAGNOSIS — L821 Other seborrheic keratosis: Secondary | ICD-10-CM | POA: Diagnosis not present

## 2021-06-02 ENCOUNTER — Other Ambulatory Visit: Payer: Self-pay | Admitting: Family Medicine

## 2021-06-02 ENCOUNTER — Other Ambulatory Visit: Payer: Self-pay | Admitting: Obstetrics & Gynecology

## 2021-06-02 DIAGNOSIS — Z1231 Encounter for screening mammogram for malignant neoplasm of breast: Secondary | ICD-10-CM

## 2021-06-03 DIAGNOSIS — B351 Tinea unguium: Secondary | ICD-10-CM | POA: Diagnosis not present

## 2021-07-08 ENCOUNTER — Ambulatory Visit
Admission: RE | Admit: 2021-07-08 | Discharge: 2021-07-08 | Disposition: A | Discharge status: Home / Self Care | Payer: Medicare Other | Source: Ambulatory Visit | Attending: Obstetrics & Gynecology | Admitting: Obstetrics & Gynecology

## 2021-07-08 ENCOUNTER — Other Ambulatory Visit: Payer: Self-pay | Admitting: Obstetrics & Gynecology

## 2021-07-08 DIAGNOSIS — Z1231 Encounter for screening mammogram for malignant neoplasm of breast: Secondary | ICD-10-CM

## 2022-02-04 ENCOUNTER — Other Ambulatory Visit: Payer: Self-pay | Admitting: Obstetrics & Gynecology

## 2022-02-04 DIAGNOSIS — Z1231 Encounter for screening mammogram for malignant neoplasm of breast: Secondary | ICD-10-CM

## 2022-05-22 IMAGING — MG MM DIGITAL SCREENING UNILAT*R* W/ TOMO W/ CAD
6 series · 6 of 18 positions shown · non-contrast
Comparison: Previous exam(s).

CLINICAL DATA: Screening.

EXAM:
DIGITAL SCREENING UNILATERAL RIGHT MAMMOGRAM WITH CAD AND
TOMOSYNTHESIS
TECHNIQUE: Right screening digital craniocaudal and mediolateral oblique
mammograms were obtained. Right screening digital breast
tomosynthesis was performed. The images were evaluated with
computer-aided detection.

[R MLO synth-2D (1 of 2)]
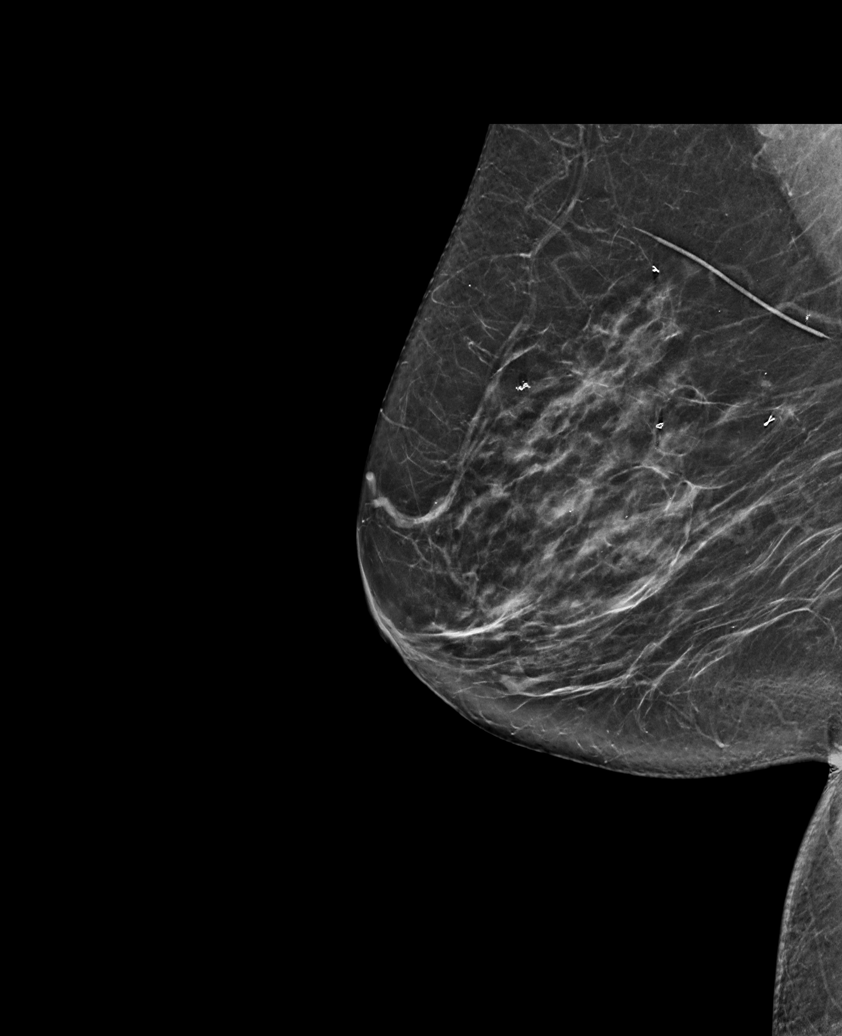

[R CC synth-2D]
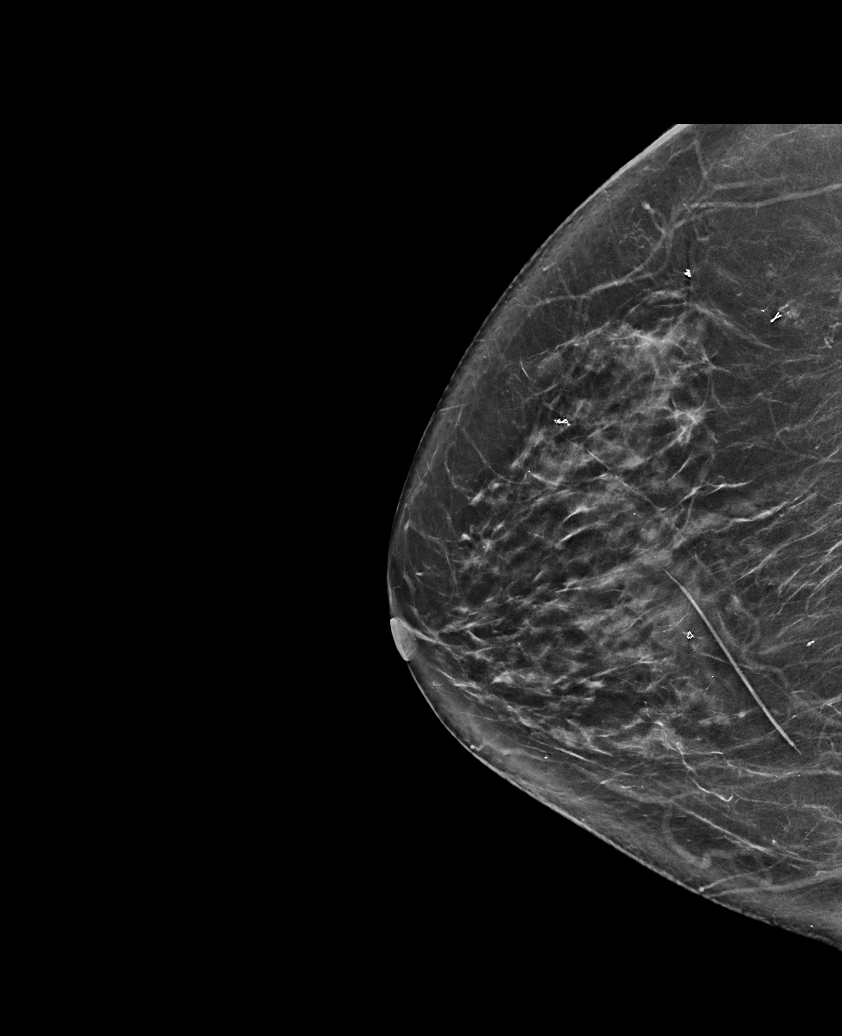

[R MLO synth-2D (2 of 2)]
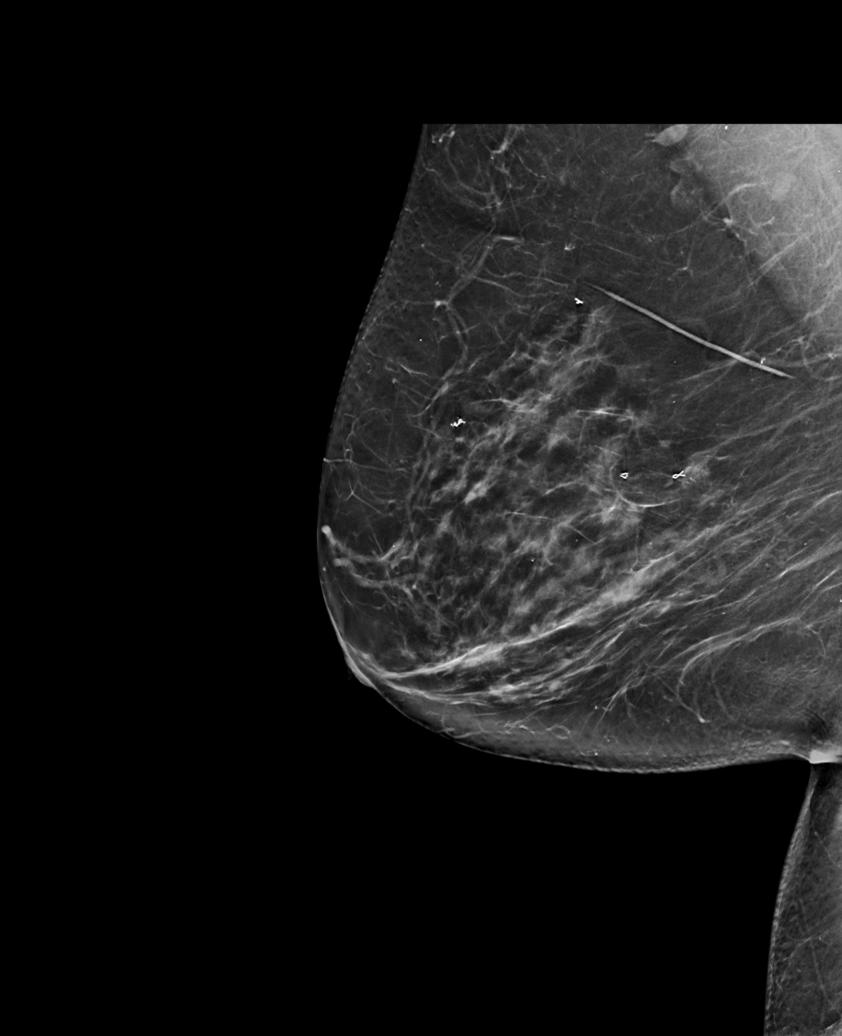

[R MLO tomo (1 of 2) · tomo slice 39/78.0]
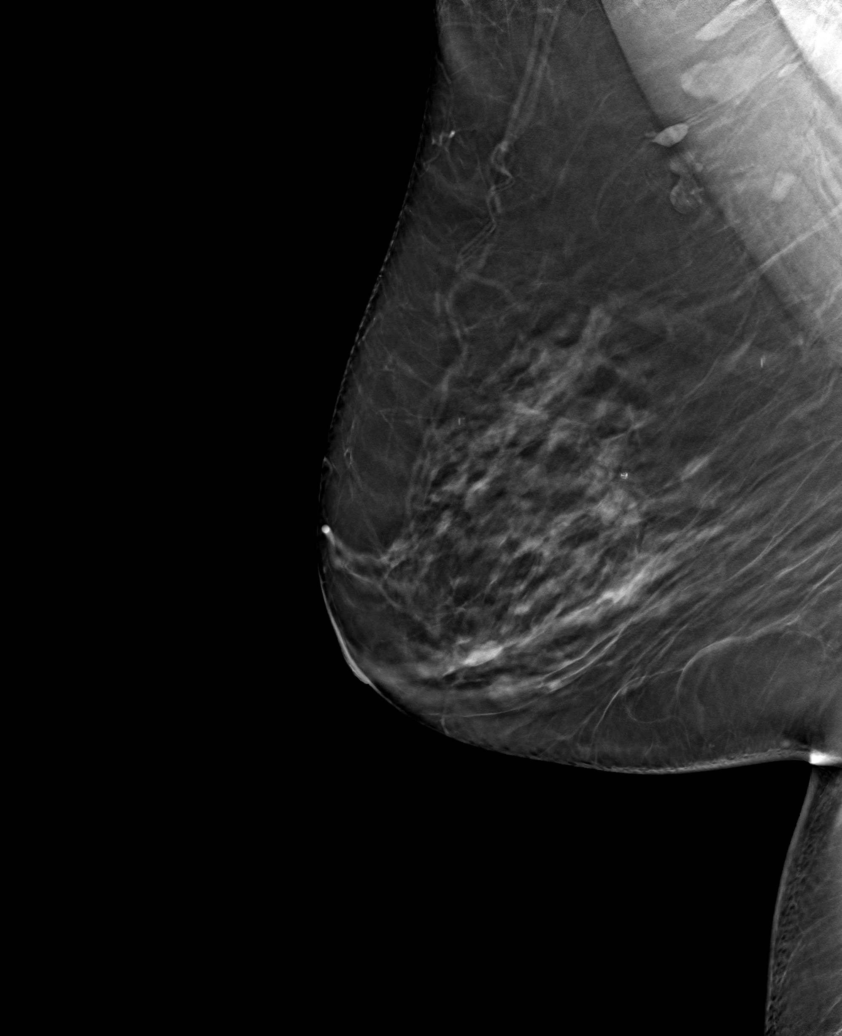

[R MLO tomo (2 of 2) · tomo slice 37/73.0]
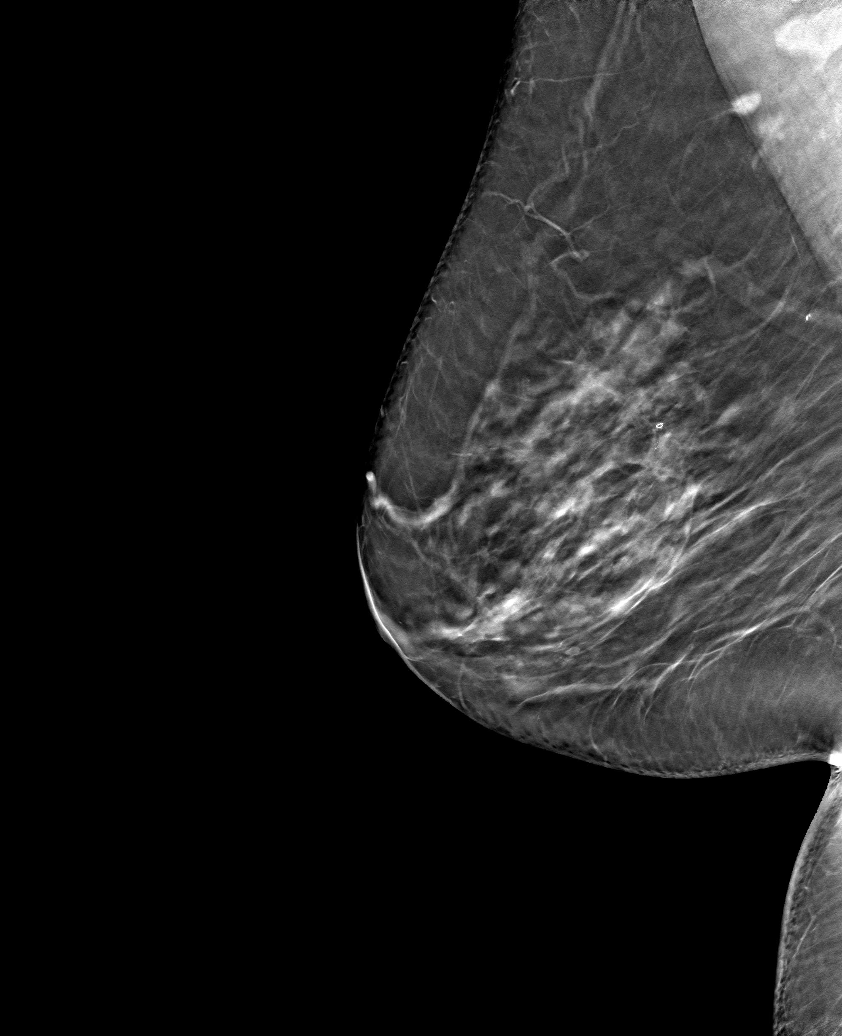

[R CC tomo · tomo slice 37/73.0]
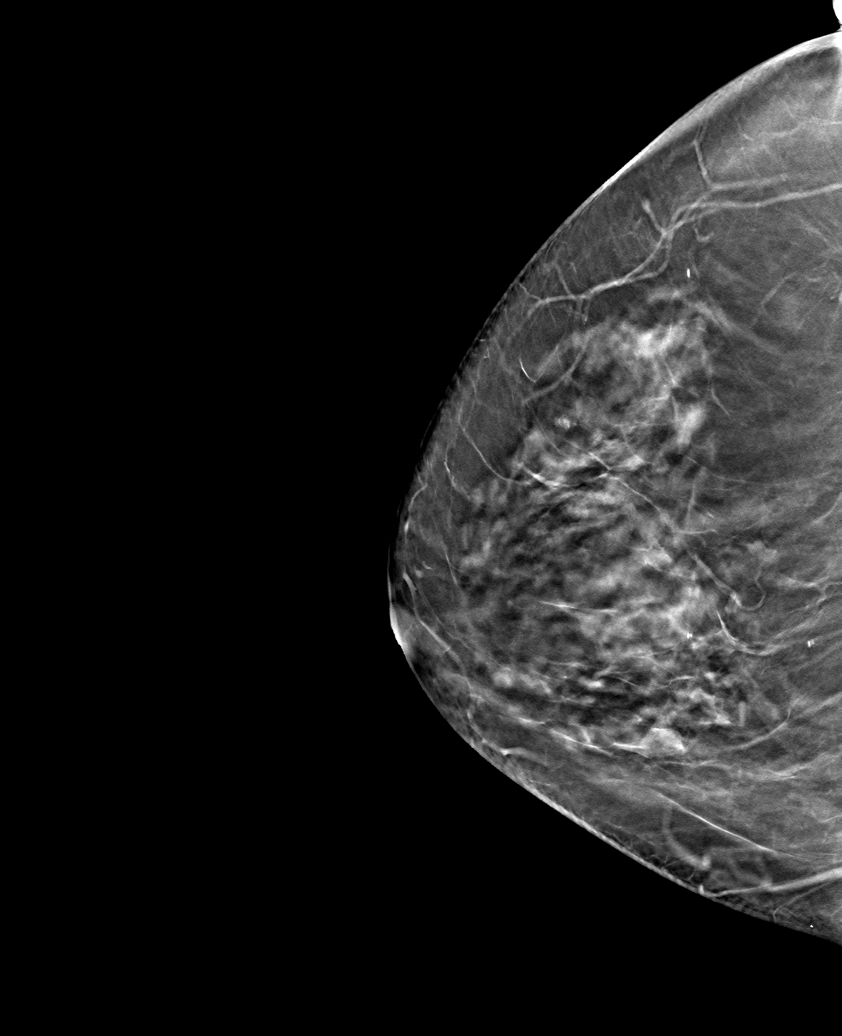

[6 of 18 positions shown; findings below may reference images not displayed]

ACR Breast Density Category c: The breast tissue is heterogeneously
dense, which may obscure small masses.
FINDINGS: The patient has had a left mastectomy. There are no findings
suspicious for malignancy.
IMPRESSION: No mammographic evidence of malignancy. A result letter of this
screening mammogram will be mailed directly to the patient.

RECOMMENDATION:
Screening mammogram in one year.  (Code:V6-Y-3H6)

BI-RADS CATEGORY  1: Negative.

## 2022-06-02 DIAGNOSIS — L80 Vitiligo: Secondary | ICD-10-CM | POA: Diagnosis not present

## 2022-06-02 DIAGNOSIS — L821 Other seborrheic keratosis: Secondary | ICD-10-CM | POA: Diagnosis not present

## 2022-06-02 DIAGNOSIS — B078 Other viral warts: Secondary | ICD-10-CM | POA: Diagnosis not present

## 2022-06-02 DIAGNOSIS — D225 Melanocytic nevi of trunk: Secondary | ICD-10-CM | POA: Diagnosis not present

## 2022-06-02 DIAGNOSIS — L814 Other melanin hyperpigmentation: Secondary | ICD-10-CM | POA: Diagnosis not present

## 2022-06-02 DIAGNOSIS — R238 Other skin changes: Secondary | ICD-10-CM | POA: Diagnosis not present

## 2022-06-23 DIAGNOSIS — R911 Solitary pulmonary nodule: Secondary | ICD-10-CM | POA: Diagnosis not present

## 2022-06-23 DIAGNOSIS — N200 Calculus of kidney: Secondary | ICD-10-CM | POA: Diagnosis not present

## 2022-06-23 DIAGNOSIS — R7303 Prediabetes: Secondary | ICD-10-CM | POA: Diagnosis not present

## 2022-06-23 DIAGNOSIS — Z79899 Other long term (current) drug therapy: Secondary | ICD-10-CM | POA: Diagnosis not present

## 2022-06-23 DIAGNOSIS — Q6100 Congenital renal cyst, unspecified: Secondary | ICD-10-CM | POA: Diagnosis not present

## 2022-06-23 DIAGNOSIS — Z0000 Encounter for general adult medical examination without abnormal findings: Secondary | ICD-10-CM | POA: Diagnosis not present

## 2022-06-23 DIAGNOSIS — R011 Cardiac murmur, unspecified: Secondary | ICD-10-CM | POA: Diagnosis not present

## 2022-06-23 DIAGNOSIS — E559 Vitamin D deficiency, unspecified: Secondary | ICD-10-CM | POA: Diagnosis not present

## 2022-06-23 DIAGNOSIS — M25562 Pain in left knee: Secondary | ICD-10-CM | POA: Diagnosis not present

## 2022-06-23 DIAGNOSIS — M5136 Other intervertebral disc degeneration, lumbar region: Secondary | ICD-10-CM | POA: Diagnosis not present

## 2022-06-23 DIAGNOSIS — I10 Essential (primary) hypertension: Secondary | ICD-10-CM | POA: Diagnosis not present

## 2022-07-26 ENCOUNTER — Ambulatory Visit
Admission: RE | Admit: 2022-07-26 | Discharge: 2022-07-26 | Disposition: A | Discharge status: Home / Self Care | Payer: Medicare Other | Source: Ambulatory Visit | Attending: Obstetrics & Gynecology | Admitting: Obstetrics & Gynecology

## 2022-07-26 DIAGNOSIS — Z1231 Encounter for screening mammogram for malignant neoplasm of breast: Secondary | ICD-10-CM

## 2022-12-16 DIAGNOSIS — R030 Elevated blood-pressure reading, without diagnosis of hypertension: Secondary | ICD-10-CM | POA: Diagnosis not present

## 2022-12-16 DIAGNOSIS — H6123 Impacted cerumen, bilateral: Secondary | ICD-10-CM | POA: Diagnosis not present

## 2022-12-21 DIAGNOSIS — H2513 Age-related nuclear cataract, bilateral: Secondary | ICD-10-CM | POA: Diagnosis not present

## 2022-12-21 DIAGNOSIS — H25013 Cortical age-related cataract, bilateral: Secondary | ICD-10-CM | POA: Diagnosis not present

## 2022-12-21 DIAGNOSIS — H25043 Posterior subcapsular polar age-related cataract, bilateral: Secondary | ICD-10-CM | POA: Diagnosis not present

## 2022-12-21 DIAGNOSIS — H2512 Age-related nuclear cataract, left eye: Secondary | ICD-10-CM | POA: Diagnosis not present

## 2022-12-21 DIAGNOSIS — H18413 Arcus senilis, bilateral: Secondary | ICD-10-CM | POA: Diagnosis not present

## 2023-03-07 DIAGNOSIS — H2511 Age-related nuclear cataract, right eye: Secondary | ICD-10-CM | POA: Diagnosis not present

## 2023-03-07 DIAGNOSIS — H2512 Age-related nuclear cataract, left eye: Secondary | ICD-10-CM | POA: Diagnosis not present

## 2023-03-07 DIAGNOSIS — H52201 Unspecified astigmatism, right eye: Secondary | ICD-10-CM | POA: Diagnosis not present

## 2023-03-08 DIAGNOSIS — H25042 Posterior subcapsular polar age-related cataract, left eye: Secondary | ICD-10-CM | POA: Diagnosis not present

## 2023-03-08 DIAGNOSIS — H25012 Cortical age-related cataract, left eye: Secondary | ICD-10-CM | POA: Diagnosis not present

## 2023-03-08 DIAGNOSIS — H2512 Age-related nuclear cataract, left eye: Secondary | ICD-10-CM | POA: Diagnosis not present

## 2023-03-28 ENCOUNTER — Encounter (HOSPITAL_COMMUNITY): Payer: Self-pay

## 2023-03-28 ENCOUNTER — Emergency Department (HOSPITAL_COMMUNITY): Payer: Medicare Other

## 2023-03-28 ENCOUNTER — Emergency Department (INDEPENDENT_AMBULATORY_CARE_PROVIDER_SITE_OTHER): Payer: Medicare Other

## 2023-03-28 ENCOUNTER — Telehealth: Payer: Self-pay | Admitting: Cardiology

## 2023-03-28 ENCOUNTER — Emergency Department (HOSPITAL_COMMUNITY)
Admission: EM | Admit: 2023-03-28 | Discharge: 2023-03-28 | Disposition: A | Discharge status: Home / Self Care | Payer: Medicare Other | Attending: Emergency Medicine | Admitting: Emergency Medicine

## 2023-03-28 ENCOUNTER — Other Ambulatory Visit: Payer: Self-pay

## 2023-03-28 DIAGNOSIS — H25042 Posterior subcapsular polar age-related cataract, left eye: Secondary | ICD-10-CM | POA: Diagnosis not present

## 2023-03-28 DIAGNOSIS — H2512 Age-related nuclear cataract, left eye: Secondary | ICD-10-CM | POA: Diagnosis not present

## 2023-03-28 DIAGNOSIS — H25012 Cortical age-related cataract, left eye: Secondary | ICD-10-CM | POA: Diagnosis not present

## 2023-03-28 DIAGNOSIS — I4891 Unspecified atrial fibrillation: Secondary | ICD-10-CM | POA: Diagnosis not present

## 2023-03-28 DIAGNOSIS — H52202 Unspecified astigmatism, left eye: Secondary | ICD-10-CM | POA: Diagnosis not present

## 2023-03-28 DIAGNOSIS — I480 Paroxysmal atrial fibrillation: Secondary | ICD-10-CM

## 2023-03-28 DIAGNOSIS — E876 Hypokalemia: Secondary | ICD-10-CM

## 2023-03-28 LAB — BASIC METABOLIC PANEL
Anion gap: 11 (ref 5–15)
BUN: 7 mg/dL — ABNORMAL LOW (ref 8–23)
CO2: 24 mmol/L (ref 22–32)
Calcium: 9.3 mg/dL (ref 8.9–10.3)
Chloride: 101 mmol/L (ref 98–111)
Creatinine, Ser: 0.66 mg/dL (ref 0.44–1.00)
GFR, Estimated: >60 mL/min (ref >60)
Glucose, Bld: 114 mg/dL — ABNORMAL HIGH (ref 70–99)
Potassium: 3 mmol/L — ABNORMAL LOW (ref 3.5–5.1)
Sodium: 136 mmol/L (ref 135–145)

## 2023-03-28 LAB — CBC WITH DIFFERENTIAL/PLATELET
Abs Immature Granulocytes: 0.03 10*3/uL (ref 0.00–0.07)
Basophils Absolute: 0 10*3/uL (ref 0.0–0.1)
Basophils Relative: 1 %
Eosinophils Absolute: 0 10*3/uL (ref 0.0–0.5)
Eosinophils Relative: 0 %
HCT: 44.4 % (ref 36.0–46.0)
Hemoglobin: 14.4 g/dL (ref 12.0–15.0)
Immature Granulocytes: 0 %
Lymphocytes Relative: 22 %
Lymphs Abs: 1.7 10*3/uL (ref 0.7–4.0)
MCH: 26.5 pg (ref 26.0–34.0)
MCHC: 32.4 g/dL (ref 30.0–36.0)
MCV: 81.6 fL (ref 80.0–100.0)
Monocytes Absolute: 0.9 10*3/uL (ref 0.1–1.0)
Monocytes Relative: 12 %
Neutro Abs: 4.9 10*3/uL (ref 1.7–7.7)
Neutrophils Relative %: 65 %
Platelets: 239 10*3/uL (ref 150–400)
RBC: 5.44 MIL/uL — ABNORMAL HIGH (ref 3.87–5.11)
RDW: 14.2 % (ref 11.5–15.5)
WBC: 7.6 10*3/uL (ref 4.0–10.5)
nRBC: 0 % (ref 0.0–0.2)

## 2023-03-28 LAB — MAGNESIUM: Magnesium: 1.9 mg/dL (ref 1.7–2.4)

## 2023-03-28 LAB — TROPONIN I (HIGH SENSITIVITY): Troponin I (High Sensitivity): 11 ng/L (ref <18)

## 2023-03-28 LAB — TSH: TSH: 0.668 u[IU]/mL (ref 0.350–4.500)

## 2023-03-28 LAB — BRAIN NATRIURETIC PEPTIDE: B Natriuretic Peptide: 136.2 pg/mL — ABNORMAL HIGH (ref 0.0–100.0)

## 2023-03-28 MED ORDER — MORPHINE SULFATE (PF) 4 MG/ML IV SOLN: 4.0000 mg | Freq: Once | INTRAVENOUS | Status: DC

## 2023-03-28 MED ORDER — METOPROLOL SUCCINATE ER 25 MG PO TB24
25.0000 mg | ORAL_TABLET | Freq: Every day | ORAL | Status: DC
  Administered 2023-03-28: 25 mg via ORAL
  Filled 2023-03-28: qty 1

## 2023-03-28 MED ORDER — DILTIAZEM LOAD VIA INFUSION
10.0000 mg | Freq: Once | INTRAVENOUS | Status: AC
  Administered 2023-03-28: 10 mg via INTRAVENOUS
  Filled 2023-03-28: qty 10

## 2023-03-28 MED ORDER — POTASSIUM CHLORIDE 10 MEQ/100ML IV SOLN
10.0000 meq | INTRAVENOUS | Status: AC
  Administered 2023-03-28: 10 meq via INTRAVENOUS
  Filled 2023-03-28: qty 100

## 2023-03-28 MED ORDER — APIXABAN 5 MG PO TABS
5.0000 mg | ORAL_TABLET | Freq: Once | ORAL | Status: AC
  Administered 2023-03-28: 5 mg via ORAL
  Filled 2023-03-28: qty 1

## 2023-03-28 MED ORDER — SODIUM CHLORIDE 0.9 % IV BOLUS
1000.0000 mL | Freq: Once | INTRAVENOUS | Status: AC
  Administered 2023-03-28: 1000 mL via INTRAVENOUS

## 2023-03-28 MED ORDER — DILTIAZEM HCL-DEXTROSE 125-5 MG/125ML-% IV SOLN (PREMIX)
5.0000 mg/h | INTRAVENOUS | Status: DC
  Administered 2023-03-28: 5 mg/h via INTRAVENOUS
  Filled 2023-03-28: qty 125

## 2023-03-28 MED ORDER — METOPROLOL SUCCINATE ER 25 MG PO TB24
25.0000 mg | ORAL_TABLET | Freq: Every day | ORAL | 0 refills | Status: DC
Start: 2023-03-28 — End: 2023-09-06

## 2023-03-28 MED ORDER — ONDANSETRON 4 MG PO TBDP: 8.0000 mg | ORAL_TABLET | Freq: Once | ORAL | Status: DC

## 2023-03-28 MED ORDER — POTASSIUM CHLORIDE ER 10 MEQ PO TBCR
20.0000 meq | EXTENDED_RELEASE_TABLET | Freq: Every day | ORAL | 0 refills | Status: DC
Start: 2023-03-28 — End: 2023-04-25

## 2023-03-28 MED ORDER — POTASSIUM CHLORIDE CRYS ER 20 MEQ PO TBCR
40.0000 meq | EXTENDED_RELEASE_TABLET | Freq: Once | ORAL | Status: AC
  Administered 2023-03-28: 40 meq via ORAL
  Filled 2023-03-28: qty 2

## 2023-03-28 MED ORDER — DILTIAZEM HCL 25 MG/5ML IV SOLN: 10.0000 mg | Freq: Once | INTRAVENOUS | Status: DC

## 2023-03-28 MED ORDER — APIXABAN 5 MG PO TABS
5.0000 mg | ORAL_TABLET | Freq: Two times a day (BID) | ORAL | 0 refills | Status: DC
Start: 2023-03-28 — End: 2023-09-06

## 2023-03-28 NOTE — Telephone Encounter (Signed)
Left the pt a message to call the office back to arrange a lab appt with our office in 1 week (BMET), per Yvonna Alanis PA-C.   Will go ahead and place the BMET order, so that when pt returns a call back to the office, triage nursing/scheduling can just arrange the one week lab appt.   She also needs an outpatient echo scheduled with our office.  A staff message was previously sent to [facility] Echo Scheduler, to call the pt back to arrange this appt.

## 2023-03-28 NOTE — Telephone Encounter (Signed)
Can we also get patient to check a BMP in a week to check her potassium?

## 2023-03-28 NOTE — Telephone Encounter (Signed)
Patient admitted for new onset atrial fibrillation.  Will plan for 2-week cardiac monitor to assess atrial fibrillation burden.  Will route this correspondence to Andee Lineman to help facilitate.  Reading MD will be Dr. Excell Seltzer.   She also needs a echocardiogram to be scheduled, please call patient to get this arranged and performed before her appointment in October with afib clinc.   She will follow up with afib clinic post ER visit then APP after that.

## 2023-03-28 NOTE — Addendum Note (Signed)
Addended by: Loa Socks on: 03/28/2023 04:49 PM   Modules accepted: Orders

## 2023-03-28 NOTE — ED Notes (Signed)
Denies any chest pain, would like to eat or drink something -- has been NPO since yesterday for eye surgery.

## 2023-03-28 NOTE — ED Provider Notes (Signed)
[place] EMERGENCY DEPARTMENT AT [facility] Provider Note   CSN: [id number] Arrival date & time: 03/28/23  0957     History  Chief Complaint  Patient presents with   Atrial Fibrillation    Nancy Ibarra is a 72 y.o. female.  72 year old female without significant past medical history presents today for concern of atrial fibrillation.  She was at the surgery center getting cataract surgery and was noted to be in A-fib.  EKG was obtained to confirm and she was sent to the emergency room for further evaluation.  No prior history of A-fib or other cardiac disease.  Not on anticoagulation.  Currently denies any complaints.  States she feels normal.  She is currently in A-fib and rates varied between 90-140.  She states her daughter is undergoing a procedure to have a mass removed from her abdomen which has given her significant amount of stress/anxiety.  She has not had any exertional dyspnea, chest discomfort, or palpitations today past week.  The history is provided by the patient. No language interpreter was used.       Home Medications Prior to Admission medications   Medication Sig Start Date End Date Taking? Authorizing Provider  HYDROcodone-acetaminophen (NORCO) 5-325 MG tablet Take 1 tablet by mouth every 4 (four) hours as needed for moderate pain. 08/25/17   Bjorn Pippin, MD  ibuprofen (ADVIL,MOTRIN) 200 MG tablet Take 200 mg by mouth every 6 (six) hours as needed.    [provider]  meloxicam (MOBIC) 15 MG tablet Take 1 tablet (15 mg total) by mouth daily. 12/24/20   Felecia Shelling, DPM  methylPREDNISolone (MEDROL DOSEPAK) 4 MG TBPK tablet 6 day dose pack - take as directed 12/24/20   Felecia Shelling, DPM  naproxen sodium (ALEVE) 220 MG tablet Take 220 mg by mouth daily as needed.    [provider]  ondansetron (ZOFRAN) 4 MG tablet Take 1 tablet (4 mg total) by mouth 4 (four) times daily as needed for nausea or vomiting. 08/25/17   Bjorn Pippin, MD       Allergies    Oxybutynin, Oxycodone, and Sulfa antibiotics    Review of Systems   Review of Systems  Constitutional:  Negative for chills and fever.  Eyes:  Negative for visual disturbance.  Respiratory:  Negative for shortness of breath.   Cardiovascular:  Negative for chest pain, palpitations and leg swelling.  Neurological:  Negative for syncope and light-headedness.  All other systems reviewed and are negative.   Physical Exam Updated Vital Signs BP 113/66 (BP Location: Right Arm)   Pulse 98   Temp 99.4 F (37.4 C) (Oral)   Resp 17   Ht 4' 10.5" (1.486 m)   Wt 81.6 kg   SpO2 95%   BMI 36.98 kg/m  Physical Exam Vitals and nursing note reviewed.  Constitutional:      General: She is not in acute distress.    Appearance: Normal appearance. She is not ill-appearing.  HENT:     Head: Normocephalic and atraumatic.     Nose: Nose normal.  Eyes:     General: No scleral icterus.    Extraocular Movements: Extraocular movements intact.     Conjunctiva/sclera: Conjunctivae normal.  Cardiovascular:     Rate and Rhythm: Tachycardia present. Rhythm irregular.     Heart sounds: Normal heart sounds.  Pulmonary:     Effort: Pulmonary effort is normal. No respiratory distress.     Breath sounds: Normal breath sounds. No  wheezing or rales.  Musculoskeletal:        General: Normal range of motion.     Cervical back: Normal range of motion.  Skin:    General: Skin is warm and dry.  Neurological:     General: No focal deficit present.     Mental Status: She is alert. Mental status is at baseline.     ED Results / Procedures / Treatments   Labs (all labs ordered are listed, but only abnormal results are displayed) Labs Reviewed  BASIC METABOLIC PANEL  CBC WITH DIFFERENTIAL/PLATELET  MAGNESIUM  TSH  BRAIN NATRIURETIC PEPTIDE  CBC WITH DIFFERENTIAL/PLATELET  TROPONIN I (HIGH SENSITIVITY)    EKG EKG Interpretation Date/Time:  Monday March 28 2023 10:23:48  EDT Ventricular Rate:  126 PR Interval:    QRS Duration:  88 QT Interval:  306 QTC Calculation: 443 R Axis:   58  Text Interpretation: Atrial fibrillation with rapid ventricular response Nonspecific ST and T wave abnormality Abnormal ECG No previous ECGs available Confirmed by Virgina Norfolk [phone]) on 03/28/2023 10:40:57 AM  Radiology No results found.  Procedures .Critical Care  Performed by: Marita Kansas, PA-C Authorized by: Marita Kansas, PA-C   Critical care provider statement:    Critical care time (minutes):  31   Critical care was necessary to treat or prevent imminent or life-threatening deterioration of the following conditions: a fib RVR.   Critical care was time spent personally by me on the following activities:  Development of treatment plan with patient or surrogate, discussions with consultants, evaluation of patient's response to treatment, examination of patient, ordering and review of laboratory studies, ordering and review of radiographic studies, ordering and performing treatments and interventions, pulse oximetry, re-evaluation of patient's condition and review of old charts     Medications Ordered in ED Medications  diltiazem (CARDIZEM) 1 mg/mL load via infusion 10 mg (has no administration in time range)    And  diltiazem (CARDIZEM) 125 mg in dextrose 5% 125 mL (1 mg/mL) infusion (has no administration in time range)  sodium chloride 0.9 % bolus 1,000 mL (has no administration in time range)    ED Course/ Medical Decision Making/ A&P                                 Medical Decision Making Amount and/or Complexity of Data Reviewed Labs: ordered. Radiology: ordered.  Risk Prescription drug management.   72 year old female who is otherwise healthy presents today in the new onset A-fib.  She is in RVR.  Asymptomatic.  Reports significant amount of stress over the past week.  Husband is at bedside.  Unsure how long she has been in A-fib.  EKG was obtained  after the surgery when she was noted to be in an irregular rhythm during the surgery.  CHA2DS2-VASc score of 2 due to age and female gender.  Will provide Dilt push and start Dilt drip.  Will obtain labs including TSH.  Hemodynamically stable.  CBC without leukocytosis or anemia.  BMP shows potassium 3.0, glucose 114 otherwise without acute concern.  Renal function is preserved.  Initial troponin 11.  Magnesium 1.9.  BNP is slightly elevated at 136 but no significant signs of volume overload on exam.  TSH within normal limit.  Chest x-ray without signs of volume overload.  No acute cardiopulmonary process.  EKG shows A-fib with RVR.  No acute ischemic changes.   On diltiazem drip  patient remains rate controlled with rates of 70-90 with occasional rates in the low 100s.  Case discussed with cardiology.  They will evaluate patient and provide further recommendations.  I also discussed the case with Dr. Vonna Kotyk' office in regards to starting patient on anticoagulation given she had procedure this morning.  From his standpoint patient can be anticoagulated if needed.   Final Clinical Impression(s) / ED Diagnoses Final diagnoses:  Atrial fibrillation with RVR [facility])    Rx / DC Orders ED Discharge Orders     None         Marita Kansas, PA-C 03/28/23 1505    Virgina Norfolk, DO 03/28/23 1604

## 2023-03-28 NOTE — ED Provider Notes (Signed)
  Physical Exam  BP (!) 124/54   Pulse 70   Temp 99.5 F (37.5 C) (Oral)   Resp 18   Ht 4' 10.5" (1.486 m)   Wt 81.6 kg   SpO2 97%   BMI 36.98 kg/m   Physical Exam Vitals and nursing note reviewed.  Constitutional:      General: She is not in acute distress.    Appearance: Normal appearance.  HENT:     Head: Normocephalic and atraumatic.     Mouth/Throat:     Mouth: Mucous membranes are moist.     Pharynx: Oropharynx is clear.  Cardiovascular:     Rate and Rhythm: Normal rate and regular rhythm.  Pulmonary:     Effort: Pulmonary effort is normal. No respiratory distress.  Neurological:     General: No focal deficit present.     Mental Status: She is alert and oriented to person, place, and time.     Procedures  Procedures  ED Course / MDM   Clinical Course as of 03/28/23 1709  Mon Mar 28, 2023  1524 New a fib. Cataract surgery this AM. Cards to see.  [KH]  1602 Metop XL 25 [KH]    Clinical Course User Index [KH] Claretha Cooper, DO   Medical Decision Making Amount and/or Complexity of Data Reviewed Labs: ordered. Radiology: ordered.  Risk Prescription drug management.   At the time my assumption of care, patient is afebrile, hemodynamically stable, in no acute distress.  She is on diltiazem drip and with this has converted to normal sinus rhythm.  Discussed with cardiology and given her conversion, recommending initiation of Eliquis and metoprolol.  First dose of both of these given in the emergency department.  Diltiazem drip discontinued and patient maintained normal sinus rhythm with appropriate rate.  All findings discussed with patient.  She states that she does desire discharge.  Cardiology will arrange outpatient repeat BMP, echo, cardiac monitor.  Return precautions including any new or worsening chest pain or shortness of breath.  Patient reports understanding agreement.  Discharged that further acute event under my care in the emergency  department.       Claretha Cooper, DO 03/29/23 0114    Melene Plan, DO 03/29/23 1402

## 2023-03-28 NOTE — Progress Notes (Unsigned)
Enrolled for [employer] to mail a ZIO XT long term holter monitor to the patients address on file.   Dr. Cooper to read. 

## 2023-03-28 NOTE — Telephone Encounter (Signed)
Staff message sent to Echo Scheduler to call the pt back to arrange echo.  Pt will see afib clinic on 10/7 at 3 pm and Jari Favre PA-C on 10/21 at 2:20 pm.   Andee Lineman monitor tech to enroll the pts zio and mail this to her mailing address.

## 2023-03-28 NOTE — Consult Note (Addendum)
Cardiology Consultation   Patient ID: Nancy Ibarra MRN: [id number]; DOB: 07/09/1951  Admit date: 03/28/2023 Date of Consult: 03/28/2023  PCP:  Mila Palmer, MD   [facility] Providers Cardiologist:  None   {   Patient Profile:   Nancy Ibarra is a 72 y.o. female with a hx of breast cancer who is being seen 03/28/2023 for the evaluation of A-fib RVR at the request of Dr. Lockie Mola.  History of Present Illness:   Nancy Ibarra has no prior cardiac history or family history.  She denies smoking, drugs, alcohol.  She does have an echocardiogram in September 2022 that noted normal EF 55 to 60% with normal RV function.  She had mild MR.  Echocardiogram was done for possible murmur.  She was never formally evaluated by our group.  Currently patient is status post cataract surgery who had EKG that showed atrial fibrillation and was sent over to the emergency room.  She initially presented with A-fib RVR with heart rates in the 120s.  On evaluation she had low potassium 3.0 with no obvious etiologies.  She denied any vomiting or diarrhea.  Does not appear to be on any medications that would contribute to this. Overall, patient without any complaints and asymptomatic from this.  She denied any prior complaints of chest pain, dizziness, shortness of breath, palpitations, syncope, fatigue, peripheral edema.  While evaluating patient at the bedside she converted back to normal sinus rhythm with heart rates in the 80s while on diltiazem drip.    Past Medical History:  Diagnosis Date   Breast cancer (HCC) 2000   left breast    History of kidney stones    Personal history of chemotherapy 2000   Umbilical hernia     Past Surgical History:  Procedure Laterality Date   BREAST BIOPSY Right 2014   fibroadenoma   BREAST BIOPSY Right 2018   angiolipoma   EXTRACORPOREAL SHOCK WAVE LITHOTRIPSY Right 08/25/2017   Procedure: RIGHT EXTRACORPOREAL SHOCK WAVE LITHOTRIPSY (ESWL);  Surgeon: Bjorn Pippin,  MD;  Location: [facility];  Service: Urology;  Laterality: Right;   MASTECTOMY Left 2000   ovarian cyst removed     age 33    Inpatient Medications: Scheduled Meds:  apixaban  5 mg Oral Once   metoprolol succinate  25 mg Oral Daily   Continuous Infusions:   PRN Meds:   Allergies:    Allergies  Allergen Reactions   Oxybutynin     Other reaction(s): rash   Oxycodone Rash   Sulfa Antibiotics Rash    Social History:   Social History   Socioeconomic History   Marital status: Married    Spouse name: Not on file   Number of children: Not on file   Years of education: Not on file   Highest education level: Not on file  Occupational History   Not on file  Tobacco Use   Smoking status: Never   Smokeless tobacco: Never  Vaping Use   Vaping status: Never Used  Substance and Sexual Activity   Alcohol use: No   Drug use: No   Sexual activity: Not on file  Other Topics Concern   Not on file  Social History Narrative   Not on file   Social Determinants of Health   Financial Resource Strain: Not on file  Food Insecurity: Not on file  Transportation Needs: Not on file  Physical Activity: Not on file  Stress: Not on file  Social Connections: Not on file  Intimate Partner Violence: Not on file    Family History:   History reviewed. No pertinent family history.   ROS:  Please see the history of present illness.   All other ROS reviewed and negative.     Physical Exam/Data:   Vitals:   03/28/23 1300 03/28/23 1523 03/28/23 1530 03/28/23 1600  BP: 109/70  111/85 (!) 124/54  Pulse: (!) 143  72 68  Resp: 20  (!) 22 18  Temp:  99.5 F (37.5 C)    TempSrc:  Oral    SpO2: 99%  99% 97%  Weight:      Height:        Intake/Output Summary (Last 24 hours) at 03/28/2023 1615 Last data filed at 03/28/2023 1304 Gross per 24 hour  Intake 1000 ml  Output --  Net 1000 ml      03/28/2023   10:31 AM 08/25/2017    6:55 AM  Last 3 Weights  Weight (lbs) 180 lb 166 lb  Weight  (kg) 81.647 kg 75.297 kg     Body mass index is 36.98 kg/m.  General:  Well nourished, well developed, in no acute distress HEENT: normal Neck: no JVD Vascular: No carotid bruits; Distal pulses 2+ bilaterally Cardiac:  normal S1, S2; RRR; no murmur  Lungs:  clear to auscultation bilaterally, no wheezing, rhonchi or rales  Abd: soft, nontender, no hepatomegaly  Ext: no edema Musculoskeletal:  No deformities, BUE and BLE strength normal and equal Skin: warm and dry  Neuro:  CNs 2-12 intact, no focal abnormalities noted Psych:  Normal affect   EKG:  The EKG was personally reviewed and demonstrates: Initial EKG showed atrial fibrillation with heart rate 126.  Repeating EKG now Telemetry:  Telemetry was personally reviewed and demonstrates: Atrial fibrillation with very uncontrolled and erratic rates in the 120s to 80s.  She had intermittent breaks of sinus beats before maintaining normal sinus rhythm.  Relevant CV Studies: Echocardiogram 04/07/2021 1. Left ventricular ejection fraction, by estimation, is 55 to 60%. The  left ventricle has normal function. The left ventricle has no regional  wall motion abnormalities. Left ventricular diastolic parameters were  normal. The average left ventricular  global longitudinal strain is -20.0 %. The global longitudinal strain is  normal.   2. Right ventricular systolic function is normal. The right ventricular  size is normal.   3. The mitral valve is normal in structure. Mild mitral valve  regurgitation. No evidence of mitral stenosis.   4. The aortic valve is tricuspid. Aortic valve regurgitation is trivial.  Mild aortic valve sclerosis is present, with no evidence of aortic valve  stenosis.   5. The inferior vena cava is normal in size with greater than 50%  respiratory variability, suggesting right atrial pressure of 3 mmHg.    Laboratory Data:  High Sensitivity Troponin:   Recent Labs  Lab 03/28/23 1101  TROPONINIHS 11      Chemistry Recent Labs  Lab 03/28/23 1101  NA 136  K 3.0*  CL 101  CO2 24  GLUCOSE 114*  BUN 7*  CREATININE 0.66  CALCIUM 9.3  MG 1.9  GFRNONAA >60  ANIONGAP 11    No results for input(s): "PROT", "ALBUMIN", "AST", "ALT", "ALKPHOS", "BILITOT" in the last 168 hours. Lipids No results for input(s): "CHOL", "TRIG", "HDL", "LABVLDL", "LDLCALC", "CHOLHDL" in the last 168 hours.  Hematology Recent Labs  Lab 03/28/23 1102  WBC 7.6  RBC 5.44*  HGB 14.4  HCT 44.4  MCV 81.6  MCH 26.5  MCHC 32.4  RDW 14.2  PLT 239   Thyroid  Recent Labs  Lab 03/28/23 1101  TSH 0.668    BNP Recent Labs  Lab 03/28/23 1102  BNP 136.2*    DDimer No results for input(s): "DDIMER" in the last 168 hours.   Radiology/Studies:  DG Chest Port 1 View  Result Date: 03/28/2023 CLINICAL DATA:  Atrial fibrillation. EXAM: PORTABLE CHEST 1 VIEW COMPARISON:  None Available. FINDINGS: Hyperinflation. No consolidation, pneumothorax or effusion. No edema. Normal cardiopericardial silhouette. Overlapping cardiac leads. Surgical clips in the left axillary region. The extreme inferior costophrenic angles are clipped off the edge of the film. IMPRESSION: Hyperinflation.  Interstitial changes. Electronically Signed   By: Karen Kays M.D.   On: 03/28/2023 11:56     Assessment and Plan:   New onset atrial fibrillation with RVR Initially presented with heart rates in the 120s unsure chronicity, but status post cataract surgery today.  Has been reporting significant stress lately however is asymptomatic from a cardiac standpoint and denies any shortness of breath, palpitations, dizziness while in rhythm.  While evaluating patient she converted back to normal sinus rhythm on Cardizem drip. Patient likely can DC.  Repeating EKG Will plan for 2-week cardiac monitor to assess A-fib burden.  Patient has voiced not wanting to be on anticoagulation. Given relatively brief episode this may be reasonable despite elevated  CHA2DS2-VASc score of 2.  Plan for monitor/DOAC (eliquis 5mg  BID) and then reassess outpatient whether or not to continue anticoagulation. Will arrange folllow up with afib clinic.   Will also start patient on Toprol-XL 25 mg. Echo will be arranged for outpatient.  Normal TSH, previously normal echocardiogram in 2022.  Hypokalemia Potassium 3.0.  No obvious etiologies of this.  Denied any diarrhea/vomiting.  This could be driving her arrhythmia.  Will DC on 20 meq daily, this probably can be purchased OTC. BMP in 1 week.   Elevated BNP 136 Likely related to RVR.  No signs of heart failure symptoms.   Risk Assessment/Risk Scores:    CHA2DS2-VASc Score = 2  This indicates a 2.2% annual risk of stroke. The patient's score is based upon: CHF History: 0 HTN History: 0 Diabetes History: 0 Stroke History: 0 Vascular Disease History: 0 Age Score: 1 Gender Score: 1     For questions or updates, please contact  [facility] Please consult [email or web address] for contact info under    Signed, Abagail Kitchens, PA-C  03/28/2023 4:15 PM   Patient seen, examined. Available data reviewed. Agree with findings, assessment, and plan as outlined by Yvonna Alanis, PA-C.  The patient is independently interviewed and examined.  Her husband is present at the bedside.  She is alert, oriented, in no distress.  HEENT is normal, lungs are clear bilaterally, JVP is normal, there are no carotid bruits, heart is regular rate and rhythm with no murmur gallop, abdomen soft nontender, extremities have no edema.  The patient's EKG and telemetry strips are reviewed.  She was in atrial fibrillation earlier today and he has converted spontaneously back to sinus rhythm on a Cardizem drip.  She had cataract surgery earlier today and also was under a great deal of stress about her 93-year-old granddaughter having surgery for a mass in her abdomen.  She has no perception of her atrial fibrillation and specifically denied any  chest pain, heart palpitations, or shortness of breath today.  She had her other eye done 3 weeks ago and presumably was in sinus  rhythm at that time.  She has never had atrial fibrillation in the past.  We discussed treatment strategies around atrial fibrillation, potential etiologies, and further testing.  Now that she is back in sinus rhythm, I think she is medically stable to discharge home from the emergency room.  She should not require hospital admission.  Will discontinue her Cardizem drip and start her on metoprolol succinate 25 mg daily to be taken for at least the next month.  Will start her on apixaban 5 mg twice daily also to be taken for 30 days minimum.  She is not keen on long-term anticoagulation.  We have recommended an outpatient echocardiogram and 2-week ZIO monitor to evaluate for any LV dysfunction or structural heart abnormality as well as evaluate her atrial for burden.  If she has no atrial fibrillation identified, can consider a limited course of anticoagulation.  Will arrange follow-up in the atrial fibrillation clinic and I will be happy to see her long-term.  Lab work is reviewed and demonstrates normal renal function, normal troponin, normal blood counts, and hypokalemia with treatment as outlined above.  TSH is also shown to be normal.  Tonny Bollman, M.D. 03/28/2023 5:27 PM

## 2023-03-28 NOTE — ED Triage Notes (Signed)
Pt came from eye surgery, pt had EKG done after surgery that showed a fib, no hx of a fib. Pt has no chest pain or palpations, pt states she is under a lot of stress today.

## 2023-03-28 NOTE — Addendum Note (Signed)
Addended byYvonna Alanis on: 03/28/2023 04:41 PM   Modules accepted: Orders

## 2023-03-30 NOTE — Telephone Encounter (Signed)
RE: NEEDS AN ECHO Received: Mervyn Skeeters, LPN Called to schedule and had to leave a voicemail.

## 2023-03-31 NOTE — Telephone Encounter (Signed)
Pts lab appt is scheduled for 9/17 and echo scheduled for 9/24.  Pt made aware of these appts by Scheduling team.  Pt will see Jari Favre PA-C on 05/09/23.

## 2023-04-01 DIAGNOSIS — Z03818 Encounter for observation for suspected exposure to other biological agents ruled out: Secondary | ICD-10-CM | POA: Diagnosis not present

## 2023-04-01 DIAGNOSIS — R310 Gross hematuria: Secondary | ICD-10-CM | POA: Diagnosis not present

## 2023-04-01 DIAGNOSIS — R059 Cough, unspecified: Secondary | ICD-10-CM | POA: Diagnosis not present

## 2023-04-03 DIAGNOSIS — I480 Paroxysmal atrial fibrillation: Secondary | ICD-10-CM | POA: Diagnosis not present

## 2023-04-05 ENCOUNTER — Ambulatory Visit: Payer: Medicare Other | Attending: Physician Assistant

## 2023-04-05 DIAGNOSIS — I480 Paroxysmal atrial fibrillation: Secondary | ICD-10-CM | POA: Diagnosis not present

## 2023-04-05 DIAGNOSIS — E876 Hypokalemia: Secondary | ICD-10-CM

## 2023-04-07 ENCOUNTER — Ambulatory Visit
Admission: RE | Admit: 2023-04-07 | Discharge: 2023-04-07 | Disposition: A | Discharge status: Home / Self Care | Payer: Medicare Other | Source: Ambulatory Visit | Attending: Family Medicine | Admitting: Family Medicine

## 2023-04-07 ENCOUNTER — Other Ambulatory Visit: Payer: Self-pay | Admitting: Family Medicine

## 2023-04-07 DIAGNOSIS — R0602 Shortness of breath: Secondary | ICD-10-CM | POA: Diagnosis not present

## 2023-04-07 DIAGNOSIS — R319 Hematuria, unspecified: Secondary | ICD-10-CM | POA: Diagnosis not present

## 2023-04-07 DIAGNOSIS — R059 Cough, unspecified: Secondary | ICD-10-CM | POA: Diagnosis not present

## 2023-04-07 DIAGNOSIS — I480 Paroxysmal atrial fibrillation: Secondary | ICD-10-CM | POA: Diagnosis not present

## 2023-04-07 DIAGNOSIS — J189 Pneumonia, unspecified organism: Secondary | ICD-10-CM

## 2023-04-07 DIAGNOSIS — N200 Calculus of kidney: Secondary | ICD-10-CM | POA: Diagnosis not present

## 2023-04-12 ENCOUNTER — Ambulatory Visit (HOSPITAL_COMMUNITY): Payer: Medicare Other | Attending: Cardiology

## 2023-04-12 DIAGNOSIS — I480 Paroxysmal atrial fibrillation: Secondary | ICD-10-CM | POA: Diagnosis not present

## 2023-04-12 LAB — ECHOCARDIOGRAM COMPLETE
Area-P 1/2: 3.05 cm2
S' Lateral: 2.7 cm

## 2023-04-22 DIAGNOSIS — I480 Paroxysmal atrial fibrillation: Secondary | ICD-10-CM | POA: Diagnosis not present

## 2023-04-25 ENCOUNTER — Encounter (HOSPITAL_COMMUNITY): Payer: Self-pay | Admitting: Physician Assistant

## 2023-04-25 ENCOUNTER — Ambulatory Visit (HOSPITAL_COMMUNITY)
Admit: 2023-04-25 | Discharge: 2023-04-25 | Disposition: A | Discharge status: Home / Self Care | Payer: Medicare Other | Attending: Physician Assistant | Admitting: Physician Assistant

## 2023-04-25 VITALS — BP 142/72 | HR 70 | Ht 58.5 in | Wt 178.2 lb

## 2023-04-25 DIAGNOSIS — Z79899 Other long term (current) drug therapy: Secondary | ICD-10-CM | POA: Insufficient documentation

## 2023-04-25 DIAGNOSIS — I480 Paroxysmal atrial fibrillation: Secondary | ICD-10-CM

## 2023-04-25 DIAGNOSIS — Z853 Personal history of malignant neoplasm of breast: Secondary | ICD-10-CM | POA: Diagnosis not present

## 2023-04-25 DIAGNOSIS — Z7901 Long term (current) use of anticoagulants: Secondary | ICD-10-CM | POA: Diagnosis not present

## 2023-04-25 MED ORDER — POTASSIUM CHLORIDE ER 10 MEQ PO TBCR: 20.0000 meq | EXTENDED_RELEASE_TABLET | Freq: Every day | ORAL | 2 refills | Status: DC

## 2023-04-25 NOTE — Progress Notes (Signed)
Primary Care Physician: Mila Palmer, MD Primary Cardiologist: Tonny Bollman, MD Electrophysiologist: None  Referring Physician: Dr Clarita Leber is a 72 y.o. female with a history of breast cancer and atrial fibrillation who presents for follow up in the [facility].  The patient was initially diagnosed with atrial fibrillation 03/28/23 after cataract surgery. She was sent to the ED for evaluation. ECG at the ED showed afib with RVR. She converted to SR with a diltiazem drip. Her K+ was 3.0 and was repleated. In hindsight, patient had noticed her heart racing prior to surgery but attributed this to some bad news regarding her granddaughter. Patient is on Eliquis for a CHADS2VASC score of 2.  On follow up today, patient reports that she feels well today. She has not had any further tachypalpitations. No bleeding issues on anticoagulation.   Today, she denies symptoms of palpitations, chest pain, shortness of breath, orthopnea, PND, lower extremity edema, dizziness, presyncope, syncope, snoring, daytime somnolence, bleeding, or neurologic sequela. The patient is tolerating medications without difficulties and is otherwise without complaint today.    Atrial Fibrillation Risk Factors:  she does not have symptoms or diagnosis of sleep apnea. she does not have a history of rheumatic fever. she does not have a history of alcohol use. The patient does not have a history of early familial atrial fibrillation or other arrhythmias.  Atrial Fibrillation Management history:  Previous antiarrhythmic drugs: none Previous cardioversions: none Previous ablations: none Anticoagulation history: Eliquis  ROS- All systems are reviewed and negative except as per the HPI above.  Past Medical History:  Diagnosis Date   Breast cancer (HCC) 2000   left breast    History of kidney stones    Personal history of chemotherapy 2000   Umbilical hernia     Current  Outpatient Medications  Medication Sig Dispense Refill   apixaban (ELIQUIS) 5 MG TABS tablet Take 1 tablet (5 mg total) by mouth 2 (two) times daily. 60 tablet 0   HYDROcodone-acetaminophen (NORCO) 5-325 MG tablet Take 1 tablet by mouth every 4 (four) hours as needed for moderate pain. 12 tablet 0   ibuprofen (ADVIL,MOTRIN) 200 MG tablet Take 200 mg by mouth every 6 (six) hours as needed.     meloxicam (MOBIC) 15 MG tablet Take 1 tablet (15 mg total) by mouth daily. 30 tablet 1   methylPREDNISolone (MEDROL DOSEPAK) 4 MG TBPK tablet 6 day dose pack - take as directed 21 tablet 0   metoprolol succinate (TOPROL-XL) 25 MG 24 hr tablet Take 1 tablet (25 mg total) by mouth daily. 30 tablet 0   naproxen sodium (ALEVE) 220 MG tablet Take 220 mg by mouth daily as needed.     ondansetron (ZOFRAN) 4 MG tablet Take 1 tablet (4 mg total) by mouth 4 (four) times daily as needed for nausea or vomiting. 8 tablet 1   potassium chloride (KLOR-CON) 10 MEQ tablet Take 2 tablets (20 mEq total) by mouth daily. 60 tablet 0   No current facility-administered medications for this visit.    Physical Exam: There were no vitals taken for this visit.  GEN: Well nourished, well developed in no acute distress NECK: No JVD; No carotid bruits CARDIAC: Regular rate and rhythm, no murmurs, rubs, gallops RESPIRATORY:  Clear to auscultation without rales, wheezing or rhonchi  ABDOMEN: Soft, non-tender, non-distended EXTREMITIES:  No edema; No deformity   Wt Readings from Last 3 Encounters:  03/28/23 81.6 kg  08/25/17  75.3 kg     EKG today demonstrates  SR Vent. rate 70 BPM PR interval 180 ms QRS duration 80 ms QT/QTcB 384/414 ms  Echo 04/12/23 demonstrated   1. Left ventricular ejection fraction, by estimation, is 55 to 60%. Left  ventricular ejection fraction by 3D volume is 59 %. The left ventricle has  normal function. The left ventricle has no regional wall motion  abnormalities. Left ventricular diastolic  parameters are consistent with Grade I diastolic dysfunction (impaired relaxation). The average left ventricular global longitudinal strain is -20.9 %. The global longitudinal strain is normal.   2. Right ventricular systolic function is normal. The right ventricular  size is normal. Tricuspid regurgitation signal is inadequate for assessing  PA pressure.   3. The mitral valve is normal in structure. Trivial mitral valve  regurgitation. No evidence of mitral stenosis.   4. The aortic valve is tricuspid. There is mild thickening of the aortic  valve. Aortic valve regurgitation is trivial. No aortic stenosis is  present.   5. The inferior vena cava is normal in size with greater than 50%  respiratory variability, suggesting right atrial pressure of 3 mmHg.    CHA2DS2-VASc Score = 2  The patient's score is based upon: CHF History: 0 HTN History: 0 Diabetes History: 0 Stroke History: 0 Vascular Disease History: 0 Age Score: 1 Gender Score: 1       ASSESSMENT AND PLAN: Paroxysmal Atrial Fibrillation (ICD10:  I48.0) The patient's CHA2DS2-VASc score is 2, indicating a 2.2% annual risk of stroke.   General education about afib provided and questions answered. We also discussed her stroke risk and the risks and benefits of anticoagulation. Patient will continue Eliquis 5 mg BID until 30 days post discharge and then discontinue with low CV score.  Patient will also discontinue Toprol 25 mg daily, she has had symptoms of dry eyes since starting this.  Continue KCL 20 meq daily Zio monitor results pending We also discussed smart device technology for home monitoring. She has an Scientist, physiological.      Follow up with Jari Favre as scheduled.     Jorja Loa PA-C Afib Clinic [facility] [address] [place], [place] [address] [phone]

## 2023-05-06 NOTE — Progress Notes (Unsigned)
Cardiology Office Note:  .   Date:  05/09/2023  ID:  CHYRELL LIGHTNER, DOB 25-Apr-1951, MRN [id number] PCP: Mila Palmer, MD  [facility] Providers Cardiologist:  Tonny Bollman, MD {  History of Present Illness: .   Nancy Ibarra is a 72 y.o. female with a past medical history of breast cancer and atrial fibrillation who is here for follow-up visit.  She was recently seen by the A-fib clinic back on 04/25/2023.  Recent diagnosis of atrial fibrillation after cataract surgery 03/28/2023.  Sent to the ED for evaluation.  EKG showed A-fib with RVR.  Converted to normal sinus rhythm with a diltiazem drip.  Potassium was 3.0 and was repleted.  In hindsight, patient noticed her heart rate racing prior to surgery but attributed this to some bad news regarding her granddaughter.  Started on Eliquis for CHA2DS2-VASc score of 2.  When she was seen by the A-fib clinic she denied any further tachypalpitations.  No issues with bleeding on anticoagulation.  Plan was to continue Eliquis 5 mg twice daily for 30 days and then discontinued due to a low CV score.  Metoprolol 25 mg daily was also discontinued because symptoms of dry eyes.  Recommended to continue KCl 20 mEq daily for repletion.  Today, she tells me that she has been doing good from a rhythm standpoint.  She remains on potassium replacement since she has had hypokalemia in the past.  She is no longer on metoprolol or Eliquis due to low AF burden on her most recent monitor.  She does have some whitecoat syndrome today and blood pressure slightly elevated.  most recent echocardiogram and ZIO monitor reviewed with patient.  She would like to have her potassium checked today to see if she needs to continue supplementation  Reports no shortness of breath nor dyspnea on exertion. Reports no chest pain, pressure, or tightness. No edema, orthopnea, PND. Reports no palpitations.   ROS: Pertinent ROS in HPI  Studies Reviewed: .       Cardiac monitor  03/28/2023  Patch Wear Time:  13 days and 23 hours (2024-09-15T19:22:32-0400 to 2024-09-29T19:22:28-0400)   Patient had a min HR of 50 bpm, max HR of 235 bpm, and avg HR of 70 bpm. Predominant underlying rhythm was Sinus Rhythm. 43 Supraventricular Tachycardia runs occurred, the run with the fastest interval lasting 5 beats with a max rate of 235 bpm, the  longest lasting 13.2 secs with an avg rate of 116 bpm. Atrial Fibrillation occurred (<1% burden), ranging from 92-122 bpm (avg of 111 bpm), the longest lasting 31 secs with an avg rate of 111 bpm. Isolated SVEs were rare (<1.0%), SVE Couplets were rare  (<1.0%), and SVE Triplets were rare (<1.0%). Isolated VEs were rare (<1.0%), VE Couplets were rare (<1.0%), and no VE Triplets were present.    Summary: The basic rhythm is normal sinus with an average heart rate of 70 bpm.  There is an episode of A-fib that only lasted 30 seconds.  A-fib burden less than 1%.  No other significant arrhythmias identified other than findings of isolated PVCs and supraventricular ectopics outlined above.   Risk Assessment/Calculations:    CHA2DS2-VASc Score = 2   This indicates a 2.2% annual risk of stroke. The patient's score is based upon: CHF History: 0 HTN History: 0 Diabetes History: 0 Stroke History: 0 Vascular Disease History: 0 Age Score: 1 Gender Score: 1          Physical Exam:   VS:  BP (!) 142/68   Pulse 65   Ht 4\' 10"  (1.473 m)   Wt 180 lb (81.6 kg)   SpO2 97%   BMI 37.62 kg/m    Wt Readings from Last 3 Encounters:  05/09/23 180 lb (81.6 kg)  04/25/23 178 lb 3.2 oz (80.8 kg)  03/28/23 180 lb (81.6 kg)    GEN: Well nourished, well developed in no acute distress NECK: No JVD; No carotid bruits CARDIAC: RRR, no murmurs, rubs, gallops RESPIRATORY:  Clear to auscultation without rales, wheezing or rhonchi  ABDOMEN: Soft, non-tender, non-distended EXTREMITIES:  No edema; No deformity   ASSESSMENT AND PLAN: .   1.  A-fib with  RVR -flutter here and there but no consistent symptoms -not often at all, two or three times total -no lightheadedness/dizziness or syncope/presyncope  2. Cardiac murmur   -I did not appreciate one today -I have reviewed her echo without any significant valvular diseases    Dispo: She can see Dr. Excell Seltzer from the year as needed after  Signed, Sharlene Dory, PA-C

## 2023-05-09 ENCOUNTER — Encounter: Payer: Self-pay | Admitting: Physician Assistant

## 2023-05-09 ENCOUNTER — Ambulatory Visit: Payer: Medicare Other | Attending: Physician Assistant | Admitting: Physician Assistant

## 2023-05-09 VITALS — BP 142/68 | HR 65 | Ht <= 58 in | Wt 180.0 lb

## 2023-05-09 DIAGNOSIS — I480 Paroxysmal atrial fibrillation: Secondary | ICD-10-CM

## 2023-05-09 DIAGNOSIS — R011 Cardiac murmur, unspecified: Secondary | ICD-10-CM | POA: Diagnosis not present

## 2023-05-09 NOTE — Patient Instructions (Addendum)
Medication Instructions:  Your physician recommends that you continue on your current medications as directed. Please refer to the Current Medication list given to you today.  *If you need a refill on your cardiac medications before your next appointment, please call your pharmacy*  Lab Work: BMET-TODAY If you have labs (blood work) drawn today and your tests are completely normal, you will receive your results only by: MyChart Message (if you have MyChart) OR A paper copy in the mail If you have any lab test that is abnormal or we need to change your treatment, we will call you to review the results.  Follow-Up: At [facility], you and your health needs are our priority.  As part of our continuing mission to provide you with exceptional heart care, we have created designated Provider Care Teams.  These Care Teams include your primary Cardiologist (physician) and Advanced Practice Providers (APPs -  Physician Assistants and Nurse Practitioners) who all work together to provide you with the care you need, when you need it.  Your next appointment:   1 year(s)  Provider:   Tonny Bollman, MD     Low-Sodium Eating Plan Salt (sodium) helps you keep a healthy balance of fluids in your body. Too much sodium can raise your blood pressure. It can also cause fluid and waste to be held in your body. Your health care provider or dietitian may recommend a low-sodium eating plan if you have high blood pressure (hypertension), kidney disease, liver disease, or heart failure. Eating less sodium can help lower your blood pressure and reduce swelling. It can also protect your heart, liver, and kidneys. What are tips for following this plan? Reading food labels  Check food labels for the amount of sodium per serving. If you eat more than one serving, you must multiply the listed amount by the number of servings. Choose foods with less than 140 milligrams (mg) of sodium per serving. Avoid foods  with 300 mg of sodium or more per serving. Always check how much sodium is in a product, even if the label says "unsalted" or "no salt added." Shopping  Buy products labeled as "low-sodium" or "no salt added." Buy fresh foods. Avoid canned foods and pre-made or frozen meals. Avoid canned, cured, or processed meats. Buy breads that have less than 80 mg of sodium per slice. Cooking  Eat more home-cooked food. Try to eat less restaurant, buffet, and fast food. Try not to add salt when you cook. Use salt-free seasonings or herbs instead of table salt or sea salt. Check with your provider or pharmacist before using salt substitutes. Cook with plant-based oils, such as canola, sunflower, or olive oil. Meal planning When eating at a restaurant, ask if your food can be made with less salt or no salt. Avoid dishes labeled as brined, pickled, cured, or smoked. Avoid dishes made with soy sauce, miso, or teriyaki sauce. Avoid foods that have monosodium glutamate (MSG) in them. MSG may be added to some restaurant food, sauces, soups, bouillon, and canned foods. Make meals that can be grilled, baked, poached, roasted, or steamed. These are often made with less sodium. General information Try to limit your sodium intake to 1,500-2,300 mg each day, or the amount told by your provider. What foods should I eat? Fruits Fresh, frozen, or canned fruit. Fruit juice. Vegetables Fresh or frozen vegetables. "No salt added" canned vegetables. "No salt added" tomato sauce and paste. Low-sodium or reduced-sodium tomato and vegetable juice. Grains Low-sodium cereals, such  as oats, puffed wheat and rice, and shredded wheat. Low-sodium crackers. Unsalted rice. Unsalted pasta. Low-sodium bread. Whole grain breads and whole grain pasta. Meats and other proteins Fresh or frozen meat, poultry, seafood, and fish. These should have no added salt. Low-sodium canned tuna and salmon. Unsalted nuts. Dried peas, beans, and  lentils without added salt. Unsalted canned beans. Eggs. Unsalted nut butters. Dairy Milk. Soy milk. Cheese that is naturally low in sodium, such as ricotta cheese, fresh mozzarella, or Swiss cheese. Low-sodium or reduced-sodium cheese. Cream cheese. Yogurt. Seasonings and condiments Fresh and dried herbs and spices. Salt-free seasonings. Low-sodium mustard and ketchup. Sodium-free salad dressing. Sodium-free light mayonnaise. Fresh or refrigerated horseradish. Lemon juice. Vinegar. Other foods Homemade, reduced-sodium, or low-sodium soups. Unsalted popcorn and pretzels. Low-salt or salt-free chips. The items listed above may not be all the foods and drinks you can have. Talk to a dietitian to learn more. What foods should I avoid? Vegetables Sauerkraut, pickled vegetables, and relishes. Olives. [place] fries. Onion rings. Regular canned vegetables, except low-sodium or reduced-sodium items. Regular canned tomato sauce and paste. Regular tomato and vegetable juice. Frozen vegetables in sauces. Grains Instant hot cereals. Bread stuffing, pancake, and biscuit mixes. Croutons. Seasoned rice or pasta mixes. Noodle soup cups. Boxed or frozen macaroni and cheese. Regular salted crackers. Self-rising flour. Meats and other proteins Meat or fish that is salted, canned, smoked, spiced, or pickled. Precooked or cured meat, such as sausages or meat loaves. Tomasa Blase. Ham. Pepperoni. Hot dogs. Corned beef. Chipped beef. Salt pork. Jerky. Pickled herring, anchovies, and sardines. Regular canned tuna. Salted nuts. Dairy Processed cheese and cheese spreads. Hard cheeses. Cheese curds. Blue cheese. Feta cheese. String cheese. Regular cottage cheese. Buttermilk. Canned milk. Fats and oils Salted butter. Regular margarine. Ghee. Bacon fat. Seasonings and condiments Onion salt, garlic salt, seasoned salt, table salt, and sea salt. Canned and packaged gravies. Worcestershire sauce. Tartar sauce. Barbecue sauce. Teriyaki  sauce. Soy sauce, including reduced-sodium soy sauce. Steak sauce. Fish sauce. Oyster sauce. Cocktail sauce. Horseradish that you find on the shelf. Regular ketchup and mustard. Meat flavorings and tenderizers. Bouillon cubes. Hot sauce. Pre-made or packaged marinades. Pre-made or packaged taco seasonings. Relishes. Regular salad dressings. Salsa. Other foods Salted popcorn and pretzels. Corn chips and puffs. Potato and tortilla chips. Canned or dried soups. Pizza. Frozen entrees and pot pies. The items listed above may not be all the foods and drinks you should avoid. Talk to a dietitian to learn more. This information is not intended to replace advice given to you by your health care provider. Make sure you discuss any questions you have with your health care provider. Document Revised: 07/22/2022 Document Reviewed: 07/22/2022 Elsevier Patient Education  2024 [employer] Heart-Healthy Eating Plan Many factors influence your heart health, including eating and exercise habits. Heart health is also called coronary health. Coronary risk increases with abnormal blood fat (lipid) levels. A heart-healthy eating plan includes limiting unhealthy fats, increasing healthy fats, limiting salt (sodium) intake, and making other diet and lifestyle changes. What is my plan? Your health care provider may recommend that: You limit your fat intake to _________% or less of your total calories each day. You limit your saturated fat intake to _________% or less of your total calories each day. You limit the amount of cholesterol in your diet to less than _________ mg per day. You limit the amount of sodium in your diet to less than _________ mg per day. What are tips for following this plan?  Cooking Cook foods using methods other than frying. Baking, boiling, grilling, and broiling are all good options. Other ways to reduce fat include: Removing the skin from poultry. Removing all visible fats from  meats. Steaming vegetables in water or broth. Meal planning  At meals, imagine dividing your plate into fourths: Fill one-half of your plate with vegetables and green salads. Fill one-fourth of your plate with whole grains. Fill one-fourth of your plate with lean protein foods. Eat 2-4 cups of vegetables per day. One cup of vegetables equals 1 cup (91 g) broccoli or cauliflower florets, 2 medium carrots, 1 large bell pepper, 1 large sweet potato, 1 large tomato, 1 medium white potato, 2 cups (150 g) raw leafy greens. Eat 1-2 cups of fruit per day. One cup of fruit equals 1 small apple, 1 large banana, 1 cup (237 g) mixed fruit, 1 large orange,  cup (82 g) dried fruit, 1 cup (240 mL) 100% fruit juice. Eat more foods that contain soluble fiber. Examples include apples, broccoli, carrots, beans, peas, and barley. Aim to get 25-30 g of fiber per day. Increase your consumption of legumes, nuts, and seeds to 4-5 servings per week. One serving of dried beans or legumes equals  cup (90 g) cooked, 1 serving of nuts is  oz (12 almonds, 24 pistachios, or 7 walnut halves), and 1 serving of seeds equals  oz (8 g). Fats Choose healthy fats more often. Choose monounsaturated and polyunsaturated fats, such as olive and canola oils, avocado oil, flaxseeds, walnuts, almonds, and seeds. Eat more omega-3 fats. Choose salmon, mackerel, sardines, tuna, flaxseed oil, and ground flaxseeds. Aim to eat fish at least 2 times each week. Check food labels carefully to identify foods with trans fats or high amounts of saturated fat. Limit saturated fats. These are found in animal products, such as meats, butter, and cream. Plant sources of saturated fats include palm oil, palm kernel oil, and coconut oil. Avoid foods with partially hydrogenated oils in them. These contain trans fats. Examples are stick margarine, some tub margarines, cookies, crackers, and other baked goods. Avoid fried foods. General information Eat  more home-cooked food and less restaurant, buffet, and fast food. Limit or avoid alcohol. Limit foods that are high in added sugar and simple starches such as foods made using white refined flour (white breads, pastries, sweets). Lose weight if you are overweight. Losing just 5-10% of your body weight can help your overall health and prevent diseases such as diabetes and heart disease. Monitor your sodium intake, especially if you have high blood pressure. Talk with your health care provider about your sodium intake. Try to incorporate more vegetarian meals weekly. What foods should I eat? Fruits All fresh, canned (in natural juice), or frozen fruits. Vegetables Fresh or frozen vegetables (raw, steamed, roasted, or grilled). Green salads. Grains Most grains. Choose whole wheat and whole grains most of the time. Rice and pasta, including brown rice and pastas made with whole wheat. Meats and other proteins Lean, well-trimmed beef, veal, pork, and lamb. Chicken and [place] without skin. All fish and shellfish. Wild duck, rabbit, pheasant, and venison. Egg whites or low-cholesterol egg substitutes. Dried beans, peas, lentils, and tofu. Seeds and most nuts. Dairy Low-fat or nonfat cheeses, including ricotta and mozzarella. Skim or 1% milk (liquid, powdered, or evaporated). Buttermilk made with low-fat milk. Nonfat or low-fat yogurt. Fats and oils Non-hydrogenated (trans-free) margarines. Vegetable oils, including soybean, sesame, sunflower, olive, avocado, peanut, safflower, corn, canola, and cottonseed. Salad dressings or  mayonnaise made with a vegetable oil. Beverages Water (mineral or sparkling). Coffee and tea. Unsweetened ice tea. Diet beverages. Sweets and desserts Sherbet, gelatin, and fruit ice. Small amounts of dark chocolate. Limit all sweets and desserts. Seasonings and condiments All seasonings and condiments. The items listed above may not be a complete list of foods and beverages  you can eat. Contact a dietitian for more options. What foods should I avoid? Fruits Canned fruit in heavy syrup. Fruit in cream or butter sauce. Fried fruit. Limit coconut. Vegetables Vegetables cooked in cheese, cream, or butter sauce. Fried vegetables. Grains Breads made with saturated or trans fats, oils, or whole milk. Croissants. Sweet rolls. Donuts. High-fat crackers, such as cheese crackers and chips. Meats and other proteins Fatty meats, such as hot dogs, ribs, sausage, bacon, rib-eye roast or steak. High-fat deli meats, such as salami and bologna. Caviar. Domestic duck and goose. Organ meats, such as liver. Dairy Cream, sour cream, cream cheese, and creamed cottage cheese. Whole-milk cheeses. Whole or 2% milk (liquid, evaporated, or condensed). Whole buttermilk. Cream sauce or high-fat cheese sauce. Whole-milk yogurt. Fats and oils Meat fat, or shortening. Cocoa butter, hydrogenated oils, palm oil, coconut oil, palm kernel oil. Solid fats and shortenings, including bacon fat, salt pork, lard, and butter. Nondairy cream substitutes. Salad dressings with cheese or sour cream. Beverages Regular sodas and any drinks with added sugar. Sweets and desserts Frosting. Pudding. Cookies. Cakes. Pies. Milk chocolate or white chocolate. Buttered syrups. Full-fat ice cream or ice cream drinks. The items listed above may not be a complete list of foods and beverages to avoid. Contact a dietitian for more information. Summary Heart-healthy meal planning includes limiting unhealthy fats, increasing healthy fats, limiting salt (sodium) intake and making other diet and lifestyle changes. Lose weight if you are overweight. Losing just 5-10% of your body weight can help your overall health and prevent diseases such as diabetes and heart disease. Focus on eating a balance of foods, including fruits and vegetables, low-fat or nonfat dairy, lean protein, nuts and legumes, whole grains, and heart-healthy oils  and fats. This information is not intended to replace advice given to you by your health care provider. Make sure you discuss any questions you have with your health care provider. Document Revised: 08/10/2021 Document Reviewed: 08/10/2021 Elsevier Patient Education  2024 [employer].

## 2023-05-10 LAB — BASIC METABOLIC PANEL
BUN/Creatinine Ratio: 22 (ref 12–28)
BUN: 14 mg/dL (ref 8–27)
CO2: 24 mmol/L (ref 20–29)
Calcium: 10.3 mg/dL (ref 8.7–10.3)
Chloride: 104 mmol/L (ref 96–106)
Creatinine, Ser: 0.63 mg/dL (ref 0.57–1.00)
Glucose: 84 mg/dL (ref 70–99)
Potassium: 4.6 mmol/L (ref 3.5–5.2)
Sodium: 140 mmol/L (ref 134–144)
eGFR: 94 mL/min/{1.73_m2} (ref >59)

## 2023-06-14 ENCOUNTER — Other Ambulatory Visit: Payer: Self-pay | Admitting: Family Medicine

## 2023-06-14 DIAGNOSIS — Z1231 Encounter for screening mammogram for malignant neoplasm of breast: Secondary | ICD-10-CM

## 2023-07-05 DIAGNOSIS — H18413 Arcus senilis, bilateral: Secondary | ICD-10-CM | POA: Diagnosis not present

## 2023-07-05 DIAGNOSIS — H02831 Dermatochalasis of right upper eyelid: Secondary | ICD-10-CM | POA: Diagnosis not present

## 2023-07-05 DIAGNOSIS — H26491 Other secondary cataract, right eye: Secondary | ICD-10-CM | POA: Diagnosis not present

## 2023-07-05 DIAGNOSIS — Z961 Presence of intraocular lens: Secondary | ICD-10-CM | POA: Diagnosis not present

## 2023-07-08 DIAGNOSIS — I480 Paroxysmal atrial fibrillation: Secondary | ICD-10-CM | POA: Diagnosis not present

## 2023-07-08 DIAGNOSIS — E559 Vitamin D deficiency, unspecified: Secondary | ICD-10-CM | POA: Diagnosis not present

## 2023-07-08 DIAGNOSIS — I10 Essential (primary) hypertension: Secondary | ICD-10-CM | POA: Diagnosis not present

## 2023-07-08 DIAGNOSIS — Z0000 Encounter for general adult medical examination without abnormal findings: Secondary | ICD-10-CM | POA: Diagnosis not present

## 2023-07-08 DIAGNOSIS — R7303 Prediabetes: Secondary | ICD-10-CM | POA: Diagnosis not present

## 2023-07-08 LAB — LAB REPORT - SCANNED
A1c: 5.6
EGFR: 76

## 2023-07-11 DIAGNOSIS — H2511 Age-related nuclear cataract, right eye: Secondary | ICD-10-CM | POA: Diagnosis not present

## 2023-08-02 ENCOUNTER — Ambulatory Visit: Payer: Medicare Other

## 2023-08-11 DIAGNOSIS — Z1212 Encounter for screening for malignant neoplasm of rectum: Secondary | ICD-10-CM | POA: Diagnosis not present

## 2023-08-11 DIAGNOSIS — Z1211 Encounter for screening for malignant neoplasm of colon: Secondary | ICD-10-CM | POA: Diagnosis not present

## 2023-08-23 ENCOUNTER — Ambulatory Visit
Admission: RE | Admit: 2023-08-23 | Discharge: 2023-08-23 | Disposition: A | Discharge status: Home / Self Care | Payer: Medicare Other | Source: Ambulatory Visit | Attending: Family Medicine | Admitting: Family Medicine

## 2023-08-23 DIAGNOSIS — Z1231 Encounter for screening mammogram for malignant neoplasm of breast: Secondary | ICD-10-CM

## 2023-08-24 DIAGNOSIS — B078 Other viral warts: Secondary | ICD-10-CM | POA: Diagnosis not present

## 2023-08-24 DIAGNOSIS — D225 Melanocytic nevi of trunk: Secondary | ICD-10-CM | POA: Diagnosis not present

## 2023-08-24 DIAGNOSIS — L814 Other melanin hyperpigmentation: Secondary | ICD-10-CM | POA: Diagnosis not present

## 2023-08-24 DIAGNOSIS — L80 Vitiligo: Secondary | ICD-10-CM | POA: Diagnosis not present

## 2023-08-24 DIAGNOSIS — L821 Other seborrheic keratosis: Secondary | ICD-10-CM | POA: Diagnosis not present

## 2023-08-24 DIAGNOSIS — R238 Other skin changes: Secondary | ICD-10-CM | POA: Diagnosis not present

## 2023-09-06 ENCOUNTER — Ambulatory Visit: Payer: Medicare Other | Admitting: Obstetrics and Gynecology

## 2023-09-06 ENCOUNTER — Encounter: Payer: Self-pay | Admitting: Obstetrics and Gynecology

## 2023-09-06 ENCOUNTER — Other Ambulatory Visit (HOSPITAL_COMMUNITY)
Admission: RE | Admit: 2023-09-06 | Discharge: 2023-09-06 | Disposition: A | Discharge status: Home / Self Care | Payer: Medicare Other | Source: Other Acute Inpatient Hospital | Attending: Obstetrics | Admitting: Obstetrics

## 2023-09-06 VITALS — BP 146/76 | HR 75

## 2023-09-06 DIAGNOSIS — R350 Frequency of micturition: Secondary | ICD-10-CM | POA: Diagnosis not present

## 2023-09-06 DIAGNOSIS — R82998 Other abnormal findings in urine: Secondary | ICD-10-CM | POA: Insufficient documentation

## 2023-09-06 DIAGNOSIS — N813 Complete uterovaginal prolapse: Secondary | ICD-10-CM | POA: Insufficient documentation

## 2023-09-06 DIAGNOSIS — R319 Hematuria, unspecified: Secondary | ICD-10-CM

## 2023-09-06 LAB — URINALYSIS, ROUTINE W REFLEX MICROSCOPIC
Bilirubin Urine: NEGATIVE
Glucose, UA: NEGATIVE mg/dL
Ketones, ur: NEGATIVE mg/dL
Nitrite: NEGATIVE
Protein, ur: 100 mg/dL — AB
Specific Gravity, Urine: 1.01 (ref 1.005–1.030)
WBC, UA: >50 WBC/hpf (ref 0–5)
pH: 7 (ref 5.0–8.0)

## 2023-09-06 LAB — POCT URINALYSIS DIPSTICK
Bilirubin, UA: NEGATIVE
Glucose, UA: NEGATIVE
Ketones, UA: NEGATIVE
Nitrite, UA: POSITIVE
Protein, UA: POSITIVE — AB
Spec Grav, UA: 1.015 (ref 1.010–1.025)
Urobilinogen, UA: 0.2 U/dL
pH, UA: 7 (ref 5.0–8.0)

## 2023-09-06 MED ORDER — NITROFURANTOIN MONOHYD MACRO 100 MG PO CAPS: 100.0000 mg | ORAL_CAPSULE | Freq: Two times a day (BID) | ORAL | 0 refills | Status: AC

## 2023-09-06 NOTE — Patient Instructions (Signed)

## 2023-09-06 NOTE — Progress Notes (Signed)
New Patient Evaluation and Consultation  Referring Provider: Mitchel Honour, DO PCP: Mila Palmer, MD Date of Service: 09/06/2023  SUBJECTIVE Chief Complaint: New Patient (Initial Visit) Nancy Ibarra is a 73 y.o. female here for a consult for prolapse./)  History of Present Illness: Nancy Ibarra is a 73 y.o.  female seen in consultation at the request of Dr Langston Masker for evaluation of prolapse.    Review of records significant for: Stage IV uterine prolapse noted on exam, declines pessary.   Urinary Symptoms: Does not leak urine.   Day time voids 6.  Nocturia: 1 times per night to void. Voiding dysfunction:  empties bladder well.  Patient does not use a catheter to empty bladder.  When urinating, patient feels dribbling after finishing and to push on her belly or vagina to empty bladder Drinks: water, occasional hot tea or root beer  UTIs:  0  UTI's in the last year.  She does feel symptoms of UTI today- has some urgency and not being able to hold it (not normal for her) Reports history of kidney or bladder stones   Pelvic Organ Prolapse Symptoms:                  Patient Admits to a feeling of a bulge the vaginal area. It has been present for 6 months.  Patient Admits to seeing a bulge.  This bulge is bothersome.  Bowel Symptom: Bowel movements: 1-2 time(s) per day Stool consistency: soft  Straining: no.  Splinting: no.  Incomplete evacuation: no.  Patient Denies accidental bowel leakage / fecal incontinence Bowel regimen: none  HM Colonoscopy          Current Care Gaps     Colonoscopy (Every 10 Years) Never done   No completion history exists for this topic.                 Sexual Function Sexually active: no.   Pelvic Pain Denies pelvic pain   Past Medical History:  Past Medical History:  Diagnosis Date   Atrial fibrillation (HCC)    one episode   Breast cancer (HCC) 2000   left breast    History of kidney stones    Personal history of  chemotherapy 2000   Umbilical hernia     Past Surgical History:   Past Surgical History:  Procedure Laterality Date   BREAST BIOPSY Right 2014   fibroadenoma   BREAST BIOPSY Right 2018   angiolipoma   EXTRACORPOREAL SHOCK WAVE LITHOTRIPSY Right 08/25/2017   Procedure: RIGHT EXTRACORPOREAL SHOCK WAVE LITHOTRIPSY (ESWL);  Surgeon: Bjorn Pippin, MD;  Location: [facility];  Service: Urology;  Laterality: Right;   MASTECTOMY Left 2000   OVARIAN CYST REMOVAL     laparotomy, age 29     Past OB/GYN History: OB History  Gravida Para Term Preterm AB Living  3 2 2  1 2   SAB IAB Ectopic Multiple Live Births  1    2    # Outcome Date GA Lbr Len/2nd Weight Sex Type Anes PTL Lv  3 Term      Vag-Spont     2 Term      Vag-Spont     1 SAB             Menopausal: Denies vaginal bleeding since menopause Last pap smear was 05/2021- negative.     Medications: Patient has a current medication list which includes the following prescription(s): nitrofurantoin (macrocrystal-monohydrate) and potassium chloride.   Allergies: Patient  is allergic to oxybutynin, oxycodone, and sulfa antibiotics.   Social History:  Social History   Tobacco Use   Smoking status: Never   Smokeless tobacco: Never   Tobacco comments:    Never smoked 04/25/23  Vaping Use   Vaping status: Never Used  Substance Use Topics   Alcohol use: No   Drug use: No    Relationship status: married Patient lives with husband.   Patient is not employed. Regular exercise: No History of abuse: No  Family History:   Family History  Problem Relation Age of Onset   Bladder Cancer Mother    Bone cancer Father      Review of Systems: Review of Systems  Constitutional:  Negative for fever, malaise/fatigue and weight loss.  Respiratory:  Negative for cough, shortness of breath and wheezing.   Cardiovascular:  Positive for palpitations. Negative for chest pain and leg swelling.  Gastrointestinal:  Negative for abdominal pain  and blood in stool.  Genitourinary:  Negative for dysuria.  Musculoskeletal:  Negative for myalgias.  Skin:  Negative for rash.  Neurological:  Negative for dizziness and headaches.  Endo/Heme/Allergies:  Does not bruise/bleed easily.  Psychiatric/Behavioral:  Negative for depression. The patient is not nervous/anxious.      OBJECTIVE Physical Exam: Vitals:   09/06/23 1036  BP: (!) 146/76  Pulse: 75    Physical Exam Vitals reviewed. Exam conducted with a chaperone present.  Constitutional:      General: She is not in acute distress. Pulmonary:     Effort: Pulmonary effort is normal.  Abdominal:     General: There is no distension.     Palpations: Abdomen is soft.     Tenderness: There is no abdominal tenderness. There is no rebound.  Musculoskeletal:        General: No swelling. Normal range of motion.  Skin:    General: Skin is warm and dry.     Findings: No rash.  Neurological:     Mental Status: She is alert and oriented to person, place, and time.  Psychiatric:        Mood and Affect: Mood normal.        Behavior: Behavior normal.      GU / Detailed Urogynecologic Evaluation:  Pelvic Exam: Normal external female genitalia; Bartholin's and Skene's glands normal in appearance; urethral meatus normal in appearance, no urethral masses or discharge.   CST: negative  Complete procedentia noted. Normal vaginal mucosa with atrophy. Cervix normal appearance. Uterus normal single, nontender. Adnexa normal adnexa.  Prolapse easily reduced.    Pelvic floor strength I/V  Pelvic floor musculature: Right levator non-tender, Right obturator non-tender, Left levator non-tender, Left obturator non-tender  POP-Q:   POP-Q  3                                            Aa   6                                           Ba  6  C   7                                            Gh  3.5                                             Pb  6.5                                            tvl   3                                            Ap  6                                            Bp  0                                              D      Rectal Exam:  Normal external rectum  Post-Void Residual (PVR) by Bladder Scan: In order to evaluate bladder emptying, we discussed obtaining a postvoid residual and patient agreed to this procedure.  Procedure: The ultrasound unit was placed on the patient's abdomen in the suprapubic region after the patient had voided.    Post Void Residual - 09/06/23 1054       Post Void Residual   Post Void Residual 18 mL              Laboratory Results: Lab Results  Component Value Date   COLORU Yellow 09/06/2023   CLARITYU Cloudy 09/06/2023   GLUCOSEUR Negative 09/06/2023   BILIRUBINUR Negative 09/06/2023   KETONESU Negative 09/06/2023   SPECGRAV 1.015 09/06/2023   RBCUR Moderate 09/06/2023   PHUR 7.0 09/06/2023   PROTEINUR Positive (A) 09/06/2023   UROBILINOGEN 0.2 09/06/2023   LEUKOCYTESUR Small (1+) (A) 09/06/2023  Nitrites: positive  Lab Results  Component Value Date   CREATININE 0.63 05/09/2023   CREATININE 0.58 04/05/2023   CREATININE 0.66 03/28/2023    No results found for: "HGBA1C"  Lab Results  Component Value Date   HGB 14.4 03/28/2023     ASSESSMENT AND PLAN Nancy Ibarra is a 73 y.o. with:  1. Uterovaginal prolapse, complete   2. Leukocytes in urine   3. Urinary frequency   4. Hematuria, unspecified type    Uterovaginal prolapse, complete Assessment & Plan: -Stage IV anterior, Stage IV posterior, Stage IV apical prolapse - For treatment of pelvic organ prolapse, we discussed options for management including expectant management, conservative management, and surgical management, such as Kegels, a pessary, pelvic floor physical therapy, and specific surgical procedures. - She is not interested in being sexually active, therefore, would  recommend LeForte Colpocleisis, levator plication and perineorrhaphy. - Will have her undergo urodynamic  testing to look for occult incontinence  Orders: -     Ambulatory Referral For Surgery Scheduling  Leukocytes in urine Assessment & Plan: - Will treat for acute UTI  Orders: -     Urine Culture; Future -     Nitrofurantoin Monohyd Macro; Take 1 capsule (100 mg total) by mouth 2 (two) times daily for 5 days.  Dispense: 10 capsule; Refill: 0  Urinary frequency -     POCT urinalysis dipstick  Hematuria, unspecified type -     Urine Microscopic; Future    Return for urodynamics  Marguerita Beards, MD

## 2023-09-06 NOTE — Assessment & Plan Note (Signed)
-   Will treat for acute UTI

## 2023-09-06 NOTE — Assessment & Plan Note (Signed)
-  Stage IV anterior, Stage IV posterior, Stage IV apical prolapse - For treatment of pelvic organ prolapse, we discussed options for management including expectant management, conservative management, and surgical management, such as Kegels, a pessary, pelvic floor physical therapy, and specific surgical procedures. - She is not interested in being sexually active, therefore, would recommend LeForte Colpocleisis, levator plication and perineorrhaphy. - Will have her undergo urodynamic testing to look for occult incontinence

## 2023-09-07 LAB — URINE CULTURE: Culture: <10000 — AB

## 2023-09-14 ENCOUNTER — Encounter: Payer: Self-pay | Admitting: Obstetrics and Gynecology

## 2023-09-14 ENCOUNTER — Ambulatory Visit (INDEPENDENT_AMBULATORY_CARE_PROVIDER_SITE_OTHER): Payer: Medicare Other | Admitting: Obstetrics and Gynecology

## 2023-09-14 VITALS — BP 160/59 | HR 64

## 2023-09-14 DIAGNOSIS — N819 Female genital prolapse, unspecified: Secondary | ICD-10-CM | POA: Diagnosis not present

## 2023-09-14 DIAGNOSIS — R319 Hematuria, unspecified: Secondary | ICD-10-CM

## 2023-09-14 DIAGNOSIS — N393 Stress incontinence (female) (male): Secondary | ICD-10-CM | POA: Diagnosis not present

## 2023-09-14 DIAGNOSIS — N813 Complete uterovaginal prolapse: Secondary | ICD-10-CM

## 2023-09-14 DIAGNOSIS — R350 Frequency of micturition: Secondary | ICD-10-CM

## 2023-09-14 LAB — POCT URINALYSIS DIPSTICK
Bilirubin, UA: NEGATIVE
Glucose, UA: NEGATIVE
Ketones, UA: NEGATIVE
Leukocytes, UA: NEGATIVE
Nitrite, UA: NEGATIVE
Protein, UA: NEGATIVE — AB
Spec Grav, UA: <=1.005 — AB (ref 1.010–1.025)
Urobilinogen, UA: 0.2 U/dL
pH, UA: 7.5 (ref 5.0–8.0)

## 2023-09-14 NOTE — Progress Notes (Addendum)
 [facility] Urodynamics Procedure  Referring Physician: Mila Palmer, MD Date of Procedure: 09/14/2023  Nancy Ibarra is a 73 y.o. female who presents for urodynamic evaluation. Indication(s) for study: occult SUI  Vital Signs: BP (!) 160/59   Pulse 64   Laboratory Results: A clean catch urine specimen revealed:  POC urine:  Lab Results  Component Value Date   COLORU Yellow 09/14/2023   CLARITYU Clear 09/14/2023   GLUCOSEUR Negative 09/14/2023   BILIRUBINUR Negative 09/14/2023   KETONESU Negative 09/14/2023   SPECGRAV <=1.005 (A) 09/14/2023   RBCUR Moderate 09/14/2023   PHUR 7.5 09/14/2023   PROTEINUR Negative (A) 09/14/2023   UROBILINOGEN 0.2 09/14/2023   LEUKOCYTESUR Negative 09/14/2023     Voiding Diary: Deferred  Procedure Timeout:  The correct patient was verified and the correct procedure was verified. The patient was in the correct position and safety precautions were reviewed based on at the patient's history.  Urodynamic Procedure A 58F dual lumen urodynamics catheter was placed under sterile conditions into the patient's bladder. A 58F catheter was placed into the rectum in order to measure abdominal pressure. EMG patches were placed in the appropriate position.  All connections were confirmed and calibrations/adjusted made. Saline was instilled into the bladder through the dual lumen catheters.  Cough/valsalva pressures were measured periodically during filling.  Patient was allowed to void.  The bladder was then emptied of its residual.  UROFLOW: Revealed a Qmax of 7.8 mL/sec.  She voided 156 mL and had a residual of 175 mL.  It was a intermittent pattern and represented normal habits.   CMG: This was performed with sterile water in the sitting position at a fill rate of 30 mL/min.    First sensation of fullness was 251 mLs,  First urge was 392 mLs,  Strong urge was 481 mLs and  Capacity was 681 mLs  Stress incontinence was demonstrated   Highest positive Barrier CLPP was 117 cmH20 at 251 ml. Highest positive Barrier VLPP was 62 cmH20 at 251 ml.  Detrusor function was normal, with no phasic contractions seen.  Compliance:  Normal. End fill detrusor pressure was 18.9cmH20.  Calculated compliance was 41mL/cmH20  UPP: MUCP with barrier reduction was 23 cm of water.    MICTURITION STUDY: Voiding was performed with reduction using scopettes in the sitting position.  Pdet at Qmax was 19.2 cm of water.  Qmax was 26.3 mL/sec.  It was a normal pattern.  She voided 651 mL and had a residual of 30 mL.  It was a volitional void, sustained detrusor contraction was present and abdominal straining was not present  EMG: This was performed with patches.  She had voluntary contractions, recruitment with fill was present and urethral sphincter was not relaxed with void.  The details of the procedure with the study tracings have been scanned into EPIC.   Urodynamic Impression:  1. Sensation was reduced; capacity was normal 2. Stress Incontinence was demonstrated at normal pressures; 3. Detrusor Overactivity was not demonstrated. 4. Emptying was normal with a normal PVR during micturition study but mildly elevated during uroflow ( ), a sustained detrusor contraction present,  abdominal straining not present, dyssynergic urethral sphincter activity on EMG.  Plan: - The patient will follow up  to discuss the findings and treatment options.  -Patient's prolapse was reportedly causing her fullness in the pelvic floor. #5 Long Stem Gellhorn (Lot [id number]) placed. Patient reported initial cramping with insertion but states it settled in and felt comfortable. Patient was able  to void without difficulty and without pessary expulsion. Encouraged patient to call if she has any discomfort with pessary or wants it out.

## 2023-09-14 NOTE — Patient Instructions (Addendum)
 Taking Care of Yourself after Urodynamics   Drink plenty of water for a day or two following your procedure. Try to have about 8 ounces (one cup) at a time, and do this 6 times or more per day unless you have fluid restrictitons AVOID irritative beverages such as coffee, tea, soda, alcoholic or citrus drinks for a day or two, as this may cause burning with urination.Nancy Ibarra may experience some discomfort or a burning sensation with urination after having this procedure. You can use over the counter Azo or pyridium to help with burning and follow the instructions on the packaging. If it does not improve within 1-2 days, or other symptoms appear (fever, chills, or difficulty urinating) call the office to speak to a nurse.  You may return to normal daily activities such as work, school, driving, exercising and housework on the day of the procedure. If your doctor gave you a prescription, take it as ordered.    For the stress urinary incontinence please consider the options between urethral bulking and a surgical sling.   If the pessary becomes uncomfortable please call and we can take care of it.   If we have a cancellation we will call you and see if you can make that appointment.

## 2023-10-07 ENCOUNTER — Ambulatory Visit: Payer: Medicare Other | Admitting: Obstetrics and Gynecology

## 2023-10-18 ENCOUNTER — Encounter: Payer: Self-pay | Admitting: Obstetrics and Gynecology

## 2023-10-18 ENCOUNTER — Ambulatory Visit: Admitting: Obstetrics and Gynecology

## 2023-10-18 VITALS — BP 150/80 | HR 57 | Wt 189.0 lb

## 2023-10-18 DIAGNOSIS — Z01818 Encounter for other preprocedural examination: Secondary | ICD-10-CM | POA: Diagnosis not present

## 2023-10-18 DIAGNOSIS — Z4689 Encounter for fitting and adjustment of other specified devices: Secondary | ICD-10-CM

## 2023-10-18 DIAGNOSIS — N813 Complete uterovaginal prolapse: Secondary | ICD-10-CM

## 2023-10-18 DIAGNOSIS — N393 Stress incontinence (female) (male): Secondary | ICD-10-CM | POA: Diagnosis not present

## 2023-10-18 MED ORDER — POLYETHYLENE GLYCOL 3350 17 GM/SCOOP PO POWD: 17.0000 g | Freq: Every day | ORAL | 0 refills | Status: AC

## 2023-10-18 MED ORDER — ACETAMINOPHEN 500 MG PO TABS
500.0000 mg | ORAL_TABLET | Freq: Four times a day (QID) | ORAL | 0 refills | Status: AC | PRN
Start: 2023-10-18 — End: ?

## 2023-10-18 MED ORDER — TRAMADOL HCL 50 MG PO TABS: 50.0000 mg | ORAL_TABLET | Freq: Three times a day (TID) | ORAL | 0 refills | Status: AC | PRN

## 2023-10-18 MED ORDER — ESTRADIOL 0.1 MG/GM VA CREA: 0.5000 g | TOPICAL_CREAM | VAGINAL | 11 refills | Status: AC

## 2023-10-18 MED ORDER — IBUPROFEN 600 MG PO TABS
600.0000 mg | ORAL_TABLET | Freq: Four times a day (QID) | ORAL | 0 refills | Status: AC | PRN
Start: 2023-10-18 — End: ?

## 2023-10-18 NOTE — H&P (Signed)
 [facility] Pre-Operative H&P  Subjective Chief Complaint: Nancy Ibarra presents for a preoperative encounter.   History of Present Illness: Nancy Ibarra is a 73 y.o. female who presents for preoperative visit.  She is scheduled to undergo Exam under anesthesia, LeForte Colpocleisis, levator plication and perineorrhaphy, urethral bulking, cystoscopy on 11/07/23.  Her symptoms include vaginal bulge and incontinence, and she was was found to have Stage IV anterior, Stage IV posterior, Stage IV apical prolapse.   Urodynamic Impression:  1. Sensation was reduced; capacity was normal 2. Stress Incontinence was demonstrated at normal pressures; 3. Detrusor Overactivity was not demonstrated. 4. Emptying was normal with a normal PVR during micturition study but mildly elevated during uroflow ( ), a sustained detrusor contraction present,  abdominal straining not present, dyssynergic urethral sphincter activity on EMG.  Past Medical History:  Diagnosis Date   Atrial fibrillation [facility])    one episode   Breast cancer (HCC) 2000   left breast    History of kidney stones    Personal history of chemotherapy 2000   Umbilical hernia      Past Surgical History:  Procedure Laterality Date   BREAST BIOPSY Right 2014   fibroadenoma   BREAST BIOPSY Right 2018   angiolipoma   EXTRACORPOREAL SHOCK WAVE LITHOTRIPSY Right 08/25/2017   Procedure: RIGHT EXTRACORPOREAL SHOCK WAVE LITHOTRIPSY (ESWL);  Surgeon: Bjorn Pippin, MD;  Location: [facility];  Service: Urology;  Laterality: Right;   MASTECTOMY Left 2000   OVARIAN CYST REMOVAL     laparotomy, age 4    is allergic to oxybutynin, oxycodone, and sulfa antibiotics.   Family History  Problem Relation Age of Onset   Bladder Cancer Mother    Bone cancer Father     Social History   Tobacco Use   Smoking status: Never   Smokeless tobacco: Never   Tobacco comments:    Never smoked 04/25/23  Vaping Use   Vaping status: Never Used   Substance Use Topics   Alcohol use: No   Drug use: No     Review of Systems was negative for a full 10 system review except as noted in the History of Present Illness.  No current facility-administered medications for this encounter.  Current Outpatient Medications:    acetaminophen (TYLENOL) 500 MG tablet, Take 1 tablet (500 mg total) by mouth every 6 (six) hours as needed (pain)., Disp: 30 tablet, Rfl: 0   [START ON 10/20/2023] estradiol (ESTRACE) 0.1 MG/GM vaginal cream, Place 0.5 g vaginally 2 (two) times a week. Place 0.5g nightly for two weeks then twice a week after, Disp: 42.5 g, Rfl: 11   ibuprofen (ADVIL) 600 MG tablet, Take 1 tablet (600 mg total) by mouth every 6 (six) hours as needed., Disp: 30 tablet, Rfl: 0   Multiple Vitamins-Minerals (MULTIVITAMIN WITH MINERALS) tablet, Take 1 tablet by mouth daily., Disp: , Rfl:    polyethylene glycol powder (GLYCOLAX/MIRALAX) 17 GM/SCOOP powder, Take 17 g by mouth daily. Drink 17g (1 scoop) dissolved in water per day., Disp: 255 g, Rfl: 0   potassium chloride (KLOR-CON) 10 MEQ tablet, TAKE 2 TABLETS (20 MEQ TOTAL) BY MOUTH DAILY., Disp: , Rfl:    traMADol (ULTRAM) 50 MG tablet, Take 1 tablet (50 mg total) by mouth every 8 (eight) hours as needed for up to 5 days., Disp: 5 tablet, Rfl: 0   Objective There were no vitals filed for this visit.  Gen: NAD CV: S1 S2 RRR Lungs: Clear to auscultation bilaterally Abd:  soft, nontender   Previous Pelvic Exam showed: POP-Q   3                                            Aa   6                                           Ba   6                                              C    7                                            Gh   3.5                                            Pb   6.5                                            tvl    3                                            Ap   6                                            Bp   0                                              D          Assessment/ Plan  Assessment: The patient is a 73 y.o. year old scheduled to undergo Exam under anesthesia, LeForte Colpocleisis, levator plication and perineorrhaphy, urethral bulking, cystoscopy   Plan: General Surgical Consent: The patient has previously been counseled on alternative treatments, and the decision by the patient and provider was to proceed with the procedure listed above.  For all procedures, there are risks of bleeding, infection, damage to surrounding organs including but not limited to bowel, bladder, blood vessels, ureters and nerves, and need for further surgery if an injury were to occur. These risks are all low with minimally invasive surgery.   There are risks of numbness and weakness at any body site or buttock/rectal pain.  It is possible that baseline pain can be worsened by surgery, either with or without mesh. If surgery is vaginal, there is also a low risk of possible conversion to laparoscopy or open  abdominal incision where indicated. Very rare risks include blood transfusion, blood clot, heart attack, pneumonia, or death.   There is also a risk of short-term postoperative urinary retention with need to use a catheter. About half of patients need to go home from surgery with a catheter, which is then later removed in the office. The risk of long-term need for a catheter is very low. There is also a risk of worsening of overactive bladder.   Bulkamid:  She prefers an office procedure with urethral bulking (Bulkamid). We discussed success rate of approximately 70-80% and possible need for second injection. We reviewed that this is not a permanent procedure and the Bulkamid does dissolve over time. Risks reviewed including injury to bladder or urethra, UTI, urinary retention and hematuria.      Prolapse (with or without mesh): Risk factors for surgical failure  include things that put pressure on your pelvis and the surgical repair, including obesity,  chronic cough, and heavy lifting or straining (including lifting children or adults, straining on the toilet, or lifting heavy objects such as furniture or anything weighing >25 lbs. Risks of recurrence is 20-30% with vaginal native tissue repair and a less than 10% with sacrocolpopexy with mesh and less than 5% with colpocleisis  We discussed consent for blood products. Risks for blood transfusion include allergic reactions, other reactions that can affect different body organs and managed accordingly, transmission of infectious diseases such as HIV or Hepatitis. However, the blood is screened. Patient consents for blood products.  Pre-operative instructions:  She was instructed to not take Aspirin/NSAIDs x 7days prior to surgery.  Antibiotic prophylaxis was ordered as indicated.  Catheter use: Patient will go home with foley if needed after post-operative voiding trial.  Post-operative instructions:  She was provided with specific post-operative instructions, including precautions and signs/symptoms for which we would recommend contacting us, in addition to daytime and after-hours contact phone numbers. This was provided on a handout.   Post-operative medications: Prescriptions for motrin, tylenol, miralax, and tramadol were sent to her pharmacy. Discussed using ibuprofen and tylenol on a schedule to limit use of narcotics.   Laboratory testing:  We will check labs: CBC, BMP, type and screen  Preoperative clearance:  She does not require surgical clearance.    Post-operative follow-up:  A post-operative appointment will be made for 6 weeks from the date of surgery. If she needs a post-operative nurse visit for a voiding trial, that will be set up after she leaves the hospital.    Patient will call the clinic or use MyChart should anything change or any new issues arise.   Marguerita Beards, MD

## 2023-10-18 NOTE — Progress Notes (Signed)
 [facility] Return Visit  SUBJECTIVE  History of Present Illness: Nancy Ibarra is a 73 y.o. female seen in follow-up for prolapse. She underwent urodynamic testing.   She also had a #5 LS gellhorn pessary placed. Denies vaginal bleeding. It has been working well for her.   Urodynamic Impression:  1. Sensation was reduced; capacity was normal 2. Stress Incontinence was demonstrated at normal pressures; 3. Detrusor Overactivity was not demonstrated. 4. Emptying was normal with a normal PVR during micturition study but mildly elevated during uroflow ( ), a sustained detrusor contraction present,  abdominal straining not present, dyssynergic urethral sphincter activity on EMG.  Past Medical History: Patient  has a past medical history of Atrial fibrillation (HCC), Breast cancer (HCC) (2000), History of kidney stones, Personal history of chemotherapy (2000), and Umbilical hernia.   Past Surgical History: She  has a past surgical history that includes Mastectomy (Left, 2000); Breast biopsy (Right, 2014); Breast biopsy (Right, 2018); Extracorporeal shock wave lithotripsy (Right, 08/25/2017); and Ovarian cyst removal.   Medications: She has a current medication list which includes the following prescription(s): acetaminophen, [START ON 10/20/2023] estradiol, ibuprofen, multivitamin with minerals, polyethylene glycol powder, potassium chloride, and tramadol.   Allergies: Patient is allergic to oxybutynin, oxycodone, and sulfa antibiotics.   Social History: Patient  reports that she has never smoked. She has never used smokeless tobacco. She reports that she does not drink alcohol and does not use drugs.     OBJECTIVE     Physical Exam: Vitals:   10/18/23 0857  BP: (!) 150/80  Pulse: (!) 57  Weight: 189 lb (85.7 kg)   Gen: No apparent distress, A&O x 3.  Detailed Urogynecologic Evaluation:  Pessary removed and cleaned. Speculum exam showed no lesions. Pessary  replaced.    Prior exam showed:  POP-Q   3                                            Aa   6                                           Ba   6                                              C    7                                            Gh   3.5                                            Pb   6.5                                            tvl    3  Ap   6                                            Bp   0                                              D        ASSESSMENT AND PLAN    Ms. Nancy Ibarra is a 73 y.o. with:  1. Uterovaginal prolapse, complete   2. Pre-op evaluation   3. SUI (stress urinary incontinence, female)   4. Pessary maintenance    - Reviewed urodynamic testing. We discussed options of sling vs urethral bulking and risks and benefits of each. She prefers to have urethral bulking.   Plan for surgery: Exam under anesthesia, LeForte Colpocleisis, levator plication and perineorrhaphy, urethral bulking, cystoscopy   - We reviewed the patient's specific anatomic and functional findings, with the assistance of diagrams, and together finalized the above procedure. The planned surgical procedures were discussed along with the surgical risks outlined below, which were also provided on a detailed handout. Additional treatment options including expectant management, conservative management, medical management were discussed where appropriate.  We reviewed the benefits and risks of each treatment option.   General Surgical Risks: For all procedures, there are risks of bleeding, infection, damage to surrounding organs including but not limited to bowel, bladder, blood vessels, ureters and nerves, and need for further surgery if an injury were to occur. These risks are all low with minimally invasive surgery.   There are risks of numbness and weakness at any body site or buttock/rectal pain.  It is possible that baseline pain can be  worsened by surgery, either with or without mesh. If surgery is vaginal, there is also a low risk of possible conversion to laparoscopy or open abdominal incision where indicated. Very rare risks include blood transfusion, blood clot, heart attack, pneumonia, or death.   There is also a risk of short-term postoperative urinary retention with need to use a catheter. About half of patients need to go home from surgery with a catheter, which is then later removed in the office. The risk of long-term need for a catheter is very low. There is also a risk of worsening of overactive bladder.     Urethral bulking: She prefers an office procedure with urethral bulking (Bulkamid). We discussed success rate of approximately 70-80% and possible need for second injection. We reviewed that this is not a permanent procedure and the Bulkamid does dissolve over time. Risks reviewed including injury to bladder or urethra, UTI, urinary retention and hematuria.    Prolapse (with or without mesh): Risk factors for surgical failure  include things that put pressure on your pelvis and the surgical repair, including obesity, chronic cough, and heavy lifting or straining (including lifting children or adults, straining on the toilet, or lifting heavy objects such as furniture or anything weighing >25 lbs. Risks of recurrence is 20-30% with vaginal native tissue repair and a less than 10% with sacrocolpopexy with mesh, and less than 5% with colpoclesis  - For preop Visit:  She is required to have a visit within 30 days of her surgery.   Today we reviewed pre-operative preparation, peri-operative expectations, and post-operative instructions/recovery.  She was provided with instructional handouts. She understands not to take aspirin (>81mg ) or NSAIDs 7 days prior to surgery. Prescriptions provided for: tramadol 50mg , Ibuprofen 600mg , Tylenol 500mg , Miralax and vaginal estrogen. These prescriptions will be sent prior to  surgery.  - Medical clearance: not required  - Anticoagulant use: No - Medicaid Hysterectomy form: No - Accepts blood transfusion: Yes - Expected length of stay: outpatient  Marguerita Beards, MD

## 2023-10-20 DIAGNOSIS — N200 Calculus of kidney: Secondary | ICD-10-CM | POA: Insufficient documentation

## 2023-10-31 ENCOUNTER — Encounter (HOSPITAL_COMMUNITY): Payer: Self-pay | Admitting: Obstetrics and Gynecology

## 2023-10-31 NOTE — Progress Notes (Signed)
 Spoke w/ via phone for pre-op interview--- [facility] needs dos----   CBC, T&S, BMP and EKG per surgeon.      Lab results------ COVID test -----patient states asymptomatic no test needed Arrive at -------0900 NPO after MN NO Solid Food.  Clear liquids from MN until---0800 Pre-Surgery Ensure or G2:  Med rec completed Medications to take morning of surgery -----NONE Diabetic medication -----  GLP1 agonist last dose: GLP1 instructions:  Patient instructed no nail polish to be worn day of surgery Patient instructed to bring photo id and insurance card day of surgery Patient aware to have Driver (ride ) / caregiver    for 24 hours after surgery - Myrtie Atkinson Ollis-husband Patient Special Instructions ----- Shower with antibacterial soap. Pre-Op special Instructions -----  Patient verbalized understanding of instructions that were given at this phone interview. Patient denies chest pain, sob, fever, cough at the interview.

## 2023-11-01 ENCOUNTER — Encounter: Payer: Self-pay | Admitting: Obstetrics and Gynecology

## 2023-11-01 ENCOUNTER — Ambulatory Visit (INDEPENDENT_AMBULATORY_CARE_PROVIDER_SITE_OTHER): Admitting: Obstetrics and Gynecology

## 2023-11-01 ENCOUNTER — Other Ambulatory Visit (HOSPITAL_COMMUNITY)
Admission: RE | Admit: 2023-11-01 | Discharge: 2023-11-01 | Disposition: A | Discharge status: Home / Self Care | Source: Ambulatory Visit | Attending: Obstetrics and Gynecology | Admitting: Obstetrics and Gynecology

## 2023-11-01 VITALS — BP 151/80 | HR 65

## 2023-11-01 DIAGNOSIS — N898 Other specified noninflammatory disorders of vagina: Secondary | ICD-10-CM | POA: Insufficient documentation

## 2023-11-01 NOTE — Progress Notes (Signed)
 [facility] Return Visit  SUBJECTIVE  History of Present Illness: Nancy Ibarra is a 73 y.o. female seen as patient reports she has a bump on the right labia and is concerned about it prior to surgery.    Past Medical History: Patient  has a past medical history of Atrial fibrillation (HCC), Breast cancer (HCC) (2000), Dysrhythmia, History of kidney stones, Personal history of chemotherapy (2000), and Umbilical hernia.   Past Surgical History: She  has a past surgical history that includes Mastectomy (Left, 2000); Breast biopsy (Right, 2014); Breast biopsy (Right, 2018); Extracorporeal shock wave lithotripsy (Right, 08/25/2017); and Ovarian cyst removal.   Medications: She has a current medication list which includes the following prescription(s): acetaminophen, estradiol, ibuprofen, multivitamin with minerals, polyethylene glycol powder, and potassium chloride.   Allergies: Patient is allergic to oxybutynin, oxycodone, and sulfa antibiotics.   Social History: Patient  reports that she has never smoked. She has never used smokeless tobacco. She reports that she does not drink alcohol and does not use drugs.     OBJECTIVE     Physical Exam: Vitals:   11/01/23 1339  BP: (!) 151/80  Pulse: 65   Gen: No apparent distress, A&O x 3.  Detailed Urogynecologic Evaluation:  Pessary noted to be in place. No sign of mass or discharge. Patient has a small divot on the interior of the right vulvar area. There is no pus, bleeding, or pain with palpation. She does endorse some tenderness.  Aptima swab obtained. Does not appear discolored or a typical cyst or herpes lesion.    ASSESSMENT AND PLAN    Nancy Ibarra is a 73 y.o. with:  1. Vaginal irritation    No obvious concerning areas around the skin divot. Patient to undergo surgical repair with Dr. Frutoso Jing and was concerned that there was a mass or something there that needed to be managed prior to surgery.    Nancy Ibarra  Nancy Wahler, NP

## 2023-11-02 LAB — CERVICOVAGINAL ANCILLARY ONLY
Bacterial Vaginitis (gardnerella): NEGATIVE
Candida Glabrata: NEGATIVE
Candida Vaginitis: POSITIVE — AB
Comment: NEGATIVE
Comment: NEGATIVE
Comment: NEGATIVE
Comment: NEGATIVE
Trichomonas: NEGATIVE

## 2023-11-03 ENCOUNTER — Other Ambulatory Visit: Payer: Self-pay | Admitting: Obstetrics and Gynecology

## 2023-11-03 MED ORDER — FLUCONAZOLE 150 MG PO TABS: 150.0000 mg | ORAL_TABLET | Freq: Once | ORAL | 0 refills | Status: AC

## 2023-11-03 NOTE — Progress Notes (Signed)
 Please inform patient of yeast infection. Diflucan sent to pharmacy for patient.

## 2023-11-07 ENCOUNTER — Other Ambulatory Visit: Payer: Self-pay

## 2023-11-07 ENCOUNTER — Ambulatory Visit (HOSPITAL_BASED_OUTPATIENT_CLINIC_OR_DEPARTMENT_OTHER): Admitting: Anesthesiology

## 2023-11-07 ENCOUNTER — Encounter (HOSPITAL_COMMUNITY)
Admission: RE | Disposition: A | Discharge status: Home / Self Care | Payer: Self-pay | Source: Home / Self Care | Attending: Obstetrics and Gynecology

## 2023-11-07 ENCOUNTER — Ambulatory Visit (HOSPITAL_COMMUNITY)
Admission: RE | Admit: 2023-11-07 | Discharge: 2023-11-07 | Disposition: A | Discharge status: Home / Self Care | Attending: Obstetrics and Gynecology | Admitting: Obstetrics and Gynecology

## 2023-11-07 ENCOUNTER — Encounter (HOSPITAL_COMMUNITY): Payer: Self-pay | Admitting: Obstetrics and Gynecology

## 2023-11-07 ENCOUNTER — Ambulatory Visit (HOSPITAL_COMMUNITY): Admitting: Anesthesiology

## 2023-11-07 DIAGNOSIS — Z6841 Body Mass Index (BMI) 40.0 and over, adult: Secondary | ICD-10-CM | POA: Diagnosis not present

## 2023-11-07 DIAGNOSIS — N813 Complete uterovaginal prolapse: Secondary | ICD-10-CM | POA: Diagnosis present

## 2023-11-07 DIAGNOSIS — E66813 Obesity, class 3: Secondary | ICD-10-CM | POA: Insufficient documentation

## 2023-11-07 DIAGNOSIS — I4891 Unspecified atrial fibrillation: Secondary | ICD-10-CM | POA: Diagnosis not present

## 2023-11-07 DIAGNOSIS — N393 Stress incontinence (female) (male): Secondary | ICD-10-CM | POA: Insufficient documentation

## 2023-11-07 DIAGNOSIS — N8110 Cystocele, unspecified: Secondary | ICD-10-CM | POA: Diagnosis not present

## 2023-11-07 HISTORY — DX: Cardiac arrhythmia, unspecified: I49.9

## 2023-11-07 HISTORY — PX: PERINEOPLASTY: SHX2218

## 2023-11-07 HISTORY — PX: COLPOCLEISIS: SHX6814

## 2023-11-07 HISTORY — PX: INJECTION, BULKING AGENT, URETHRA: SHX7596

## 2023-11-07 HISTORY — PX: RECTOCELE REPAIR: SHX761

## 2023-11-07 HISTORY — PX: EXAM UNDER ANESTHESIA, PELVIC: SHX7461

## 2023-11-07 HISTORY — PX: CYSTOSCOPY: SHX5120

## 2023-11-07 LAB — CBC
HCT: 44.6 % (ref 36.0–46.0)
Hemoglobin: 14.3 g/dL (ref 12.0–15.0)
MCH: 26.6 pg (ref 26.0–34.0)
MCHC: 32.1 g/dL (ref 30.0–36.0)
MCV: 82.9 fL (ref 80.0–100.0)
Platelets: 251 10*3/uL (ref 150–400)
RBC: 5.38 MIL/uL — ABNORMAL HIGH (ref 3.87–5.11)
RDW: 15.6 % — ABNORMAL HIGH (ref 11.5–15.5)
WBC: 6 10*3/uL (ref 4.0–10.5)
nRBC: 0 % (ref 0.0–0.2)

## 2023-11-07 LAB — TYPE AND SCREEN
ABO/RH(D): B POS
Antibody Screen: NEGATIVE

## 2023-11-07 LAB — BASIC METABOLIC PANEL WITH GFR
Anion gap: 11 (ref 5–15)
BUN: 11 mg/dL (ref 8–23)
CO2: 20 mmol/L — ABNORMAL LOW (ref 22–32)
Calcium: 9.6 mg/dL (ref 8.9–10.3)
Chloride: 107 mmol/L (ref 98–111)
Creatinine, Ser: 0.56 mg/dL (ref 0.44–1.00)
GFR, Estimated: >60 mL/min (ref >60)
Glucose, Bld: 105 mg/dL — ABNORMAL HIGH (ref 70–99)
Potassium: 3.6 mmol/L (ref 3.5–5.1)
Sodium: 138 mmol/L (ref 135–145)

## 2023-11-07 LAB — ABO/RH: ABO/RH(D): B POS

## 2023-11-07 SURGERY — COLPOCLEISIS
Anesthesia: General | Site: Vagina

## 2023-11-07 MED ORDER — FENTANYL CITRATE (PF) 100 MCG/2ML IJ SOLN: 25.0000 ug | INTRAMUSCULAR | Status: DC | PRN

## 2023-11-07 MED ORDER — STERILE WATER FOR IRRIGATION IR SOLN
Status: DC | PRN
  Administered 2023-11-07: 1000 mL

## 2023-11-07 MED ORDER — SODIUM CHLORIDE (PF) 0.9 % IJ SOLN
INTRAMUSCULAR | Status: AC
  Filled 2023-11-07: qty 20

## 2023-11-07 MED ORDER — PHENAZOPYRIDINE HCL 100 MG PO TABS
200.0000 mg | ORAL_TABLET | ORAL | Status: AC
  Administered 2023-11-07: 200 mg via ORAL

## 2023-11-07 MED ORDER — ACETAMINOPHEN 500 MG PO TABS
ORAL_TABLET | ORAL | Status: AC
  Filled 2023-11-07: qty 2

## 2023-11-07 MED ORDER — ONDANSETRON HCL 4 MG/2ML IJ SOLN
INTRAMUSCULAR | Status: DC | PRN
  Administered 2023-11-07: 4 mg via INTRAVENOUS

## 2023-11-07 MED ORDER — LIDOCAINE 2% (20 MG/ML) 5 ML SYRINGE
INTRAMUSCULAR | Status: DC | PRN
  Administered 2023-11-07: 20 mg via INTRAVENOUS
  Administered 2023-11-07: 60 mg via INTRAVENOUS

## 2023-11-07 MED ORDER — ONDANSETRON HCL 4 MG/2ML IJ SOLN
INTRAMUSCULAR | Status: AC
  Filled 2023-11-07: qty 2

## 2023-11-07 MED ORDER — EPHEDRINE SULFATE-NACL 50-0.9 MG/10ML-% IV SOSY
PREFILLED_SYRINGE | INTRAVENOUS | Status: DC | PRN
  Administered 2023-11-07: 5 mg via INTRAVENOUS

## 2023-11-07 MED ORDER — MIDAZOLAM HCL 2 MG/2ML IJ SOLN
INTRAMUSCULAR | Status: AC
  Filled 2023-11-07: qty 2

## 2023-11-07 MED ORDER — CHLORHEXIDINE GLUCONATE 0.12 % MT SOLN
15.0000 mL | Freq: Once | OROMUCOSAL | Status: AC
  Administered 2023-11-07: 15 mL via OROMUCOSAL

## 2023-11-07 MED ORDER — FENTANYL CITRATE (PF) 250 MCG/5ML IJ SOLN
INTRAMUSCULAR | Status: DC | PRN
  Administered 2023-11-07: 50 ug via INTRAVENOUS

## 2023-11-07 MED ORDER — DEXAMETHASONE SODIUM PHOSPHATE 10 MG/ML IJ SOLN
INTRAMUSCULAR | Status: AC
  Filled 2023-11-07: qty 1

## 2023-11-07 MED ORDER — CHLORHEXIDINE GLUCONATE 0.12 % MT SOLN
OROMUCOSAL | Status: AC
  Filled 2023-11-07: qty 15

## 2023-11-07 MED ORDER — LACTATED RINGERS IV SOLN: INTRAVENOUS | Status: DC

## 2023-11-07 MED ORDER — ORAL CARE MOUTH RINSE: 15.0000 mL | Freq: Once | OROMUCOSAL | Status: AC

## 2023-11-07 MED ORDER — EPHEDRINE 5 MG/ML INJ
INTRAVENOUS | Status: AC
  Filled 2023-11-07: qty 5

## 2023-11-07 MED ORDER — 0.9 % SODIUM CHLORIDE (POUR BTL) OPTIME
TOPICAL | Status: DC | PRN
  Administered 2023-11-07: 1000 mL

## 2023-11-07 MED ORDER — LIDOCAINE 2% (20 MG/ML) 5 ML SYRINGE
INTRAMUSCULAR | Status: AC
  Filled 2023-11-07: qty 5

## 2023-11-07 MED ORDER — POVIDONE-IODINE 10 % EX SWAB: 2.0000 | Freq: Once | CUTANEOUS | Status: DC

## 2023-11-07 MED ORDER — MIDAZOLAM HCL 2 MG/2ML IJ SOLN
INTRAMUSCULAR | Status: DC | PRN
  Administered 2023-11-07: 2 mg via INTRAVENOUS

## 2023-11-07 MED ORDER — ACETAMINOPHEN 500 MG PO TABS
1000.0000 mg | ORAL_TABLET | ORAL | Status: AC
  Administered 2023-11-07: 1000 mg via ORAL

## 2023-11-07 MED ORDER — CEFAZOLIN SODIUM-DEXTROSE 2-4 GM/100ML-% IV SOLN
INTRAVENOUS | Status: AC
Start: 2023-11-07 — End: 2023-11-07
  Filled 2023-11-07: qty 100

## 2023-11-07 MED ORDER — PHENYLEPHRINE 80 MCG/ML (10ML) SYRINGE FOR IV PUSH (FOR BLOOD PRESSURE SUPPORT)
PREFILLED_SYRINGE | INTRAVENOUS | Status: AC
  Filled 2023-11-07: qty 10

## 2023-11-07 MED ORDER — PROPOFOL 10 MG/ML IV BOLUS
INTRAVENOUS | Status: DC | PRN
  Administered 2023-11-07: 150 mg via INTRAVENOUS

## 2023-11-07 MED ORDER — PROPOFOL 10 MG/ML IV BOLUS
INTRAVENOUS | Status: AC
  Filled 2023-11-07: qty 20

## 2023-11-07 MED ORDER — CEFAZOLIN SODIUM-DEXTROSE 2-4 GM/100ML-% IV SOLN
2.0000 g | INTRAVENOUS | Status: AC
  Administered 2023-11-07: 2 g via INTRAVENOUS

## 2023-11-07 MED ORDER — SODIUM CHLORIDE (PF) 0.9 % IJ SOLN
INTRAMUSCULAR | Status: DC | PRN
  Administered 2023-11-07: 40 mL

## 2023-11-07 MED ORDER — PHENYLEPHRINE HCL-NACL 20-0.9 MG/250ML-% IV SOLN
INTRAVENOUS | Status: DC | PRN
Start: 2023-11-07 — End: 2023-11-07
  Administered 2023-11-07: 40 ug/min via INTRAVENOUS

## 2023-11-07 MED ORDER — DEXAMETHASONE SODIUM PHOSPHATE 10 MG/ML IJ SOLN
INTRAMUSCULAR | Status: DC | PRN
  Administered 2023-11-07: 10 mg via INTRAVENOUS

## 2023-11-07 MED ORDER — FENTANYL CITRATE (PF) 250 MCG/5ML IJ SOLN
INTRAMUSCULAR | Status: AC
  Filled 2023-11-07: qty 5

## 2023-11-07 MED ORDER — PHENAZOPYRIDINE HCL 100 MG PO TABS
ORAL_TABLET | ORAL | Status: AC
  Filled 2023-11-07: qty 2

## 2023-11-07 MED ORDER — PHENYLEPHRINE 80 MCG/ML (10ML) SYRINGE FOR IV PUSH (FOR BLOOD PRESSURE SUPPORT)
PREFILLED_SYRINGE | INTRAVENOUS | Status: DC | PRN
  Administered 2023-11-07: 80 ug via INTRAVENOUS
  Administered 2023-11-07 (×3): 160 ug via INTRAVENOUS

## 2023-11-07 MED ORDER — SODIUM CHLORIDE 0.9 % IR SOLN
Status: DC | PRN
  Administered 2023-11-07: 1000 mL

## 2023-11-07 MED ORDER — AMISULPRIDE (ANTIEMETIC) 5 MG/2ML IV SOLN: 10.0000 mg | Freq: Once | INTRAVENOUS | Status: DC | PRN

## 2023-11-07 MED ORDER — ALBUMIN HUMAN 5 % IV SOLN: INTRAVENOUS | Status: DC | PRN

## 2023-11-07 SURGICAL SUPPLY — 37 items
BLADE CLIPPER SURG (BLADE) IMPLANT
BLADE SURG 15 STRL LF DISP TIS (BLADE) ×4 IMPLANT
CATH FOLEY 2WAY SLVR 5CC 12FR (CATHETERS) IMPLANT
GLOVE BIOGEL PI IND STRL 6.5 (GLOVE) ×4 IMPLANT
GLOVE BIOGEL PI IND STRL 7.0 (GLOVE) ×4 IMPLANT
GLOVE BIOGEL PI IND STRL 7.5 (GLOVE) IMPLANT
GLOVE ECLIPSE 6.0 STRL STRAW (GLOVE) ×4 IMPLANT
GLOVE SURG SYN 6.5 ES PF (GLOVE) ×3 IMPLANT
GLOVE SURG SYN 6.5 PF PI (GLOVE) IMPLANT
GOWN STRL REUS W/ TWL LRG LVL3 (GOWN DISPOSABLE) ×4 IMPLANT
GOWN STRL REUS W/ TWL XL LVL3 (GOWN DISPOSABLE) IMPLANT
GOWN STRL SURGICAL XL XLNG (GOWN DISPOSABLE) IMPLANT
HIBICLENS CHG 4% 4OZ BTL (MISCELLANEOUS) ×4 IMPLANT
HOLDER FOLEY CATH W/STRAP (MISCELLANEOUS) IMPLANT
IV NS 1000ML BAXH (IV SOLUTION) IMPLANT
KIT TURNOVER KIT B (KITS) ×4 IMPLANT
MANIFOLD NEPTUNE II (INSTRUMENTS) ×4 IMPLANT
NDL HYPO 22X1.5 SAFETY MO (MISCELLANEOUS) ×4 IMPLANT
NEEDLE HYPO 22X1.5 SAFETY MO (MISCELLANEOUS) ×3 IMPLANT
NS IRRIG 1000ML POUR BTL (IV SOLUTION) IMPLANT
PACK VAGINAL WOMENS (CUSTOM PROCEDURE TRAY) ×4 IMPLANT
PAD OB MATERNITY 11 LF (PERSONAL CARE ITEMS) IMPLANT
RETRACTOR LONE STAR DISPOSABLE (INSTRUMENTS) ×4 IMPLANT
RETRACTOR STAY HOOK 5MM (MISCELLANEOUS) ×4 IMPLANT
SET IRRIG Y TYPE TUR BLADDER L (SET/KITS/TRAYS/PACK) ×4 IMPLANT
SLEEVE SCD COMPRESS KNEE MED (STOCKING) ×4 IMPLANT
SPIKE FLUID TRANSFER (MISCELLANEOUS) IMPLANT
SUCTION TUBE FRAZIER 10FR DISP (SUCTIONS) ×4 IMPLANT
SUT VIC AB 0 CT1 27XBRD ANTBC (SUTURE) ×4 IMPLANT
SUT VIC AB 0 CT1 27XCR 8 STRN (SUTURE) ×4 IMPLANT
SUT VIC AB 2-0 SH 27XBRD (SUTURE) ×4 IMPLANT
SUT VICRYL 2-0 SH 8X27 (SUTURE) ×4 IMPLANT
SYR BULB EAR ULCER 3OZ GRN STR (SYRINGE) ×4 IMPLANT
SYR BULB IRRIG 60ML STRL (SYRINGE) IMPLANT
SYSTEM URETHRAL BULK BULKAMID (Female Continence) IMPLANT
TOWEL GREEN STERILE FF (TOWEL DISPOSABLE) ×4 IMPLANT
TRAY FOLEY W/BAG SLVR 14FR LF (SET/KITS/TRAYS/PACK) ×4 IMPLANT

## 2023-11-07 NOTE — Op Note (Signed)
 Operative Note  Preoperative Diagnosis: anterior vaginal prolapse, posterior vaginal prolapse, uterovaginal prolapse, complete, and stress urinary incontinence  Postoperative Diagnosis: same  Procedures performed:  LeForte Colpocleisis, levator plication, perineorrhaphy, cystoscopy, urethral bulking (Bulkamid)  Implants:  Implant Name Type Inv. Item Serial No. Manufacturer Lot No. LRB No. Used Action  SYSTEM Alla Ar [id number] Female Continence SYSTEM URETHRAL [employer] [id number]  1 Implanted    Attending Surgeon: Ollen Beverage, MD  Assistant: Kirstie Percy, PA  Anesthesia: General LMA  Findings: 1. On vaginal exam, stage IV prolapse present  2. On cystoscopy, normal bladder and urethral mucosa without injury or lesion. Brisk bilateral ureteral efflux present.    Specimens: none  Estimated blood loss: 150 mL  IV fluids: 1500 mL  Urine output: 1100 mL  Complications: none  Procedure in Detail:  After informed consent was obtained, the patient was taken to the operating room where she was placed under anesthesia.  She was then placed in the dorsal lithotomy position, taking care to avoid traction on the extremities. She was then prepped and draped in the usual sterile fashion.  A self-retaining lonestar retractor was placed using four elastic blue stays.  After a foley catheter was inserted into the urethra, the location of the midurethra was palpated. Two Allis clamps were placed at the vaginal apex. 1% lidocaine  with epinephrine  was injected into the vaginal mucosa circumferentially.  A marking pen was used to delineate anterior and posterior rectangles of vaginal mucosa to remove with remaining lateral tunnels. 1% lidocaine  with epinephrine  was injected into the mucosa. An incision was made with a 15 blade scalpel along these marked lines. After an Allis clamp was placed on the vaginal mucosa, the underlying vaginal muscularis was dissected  off the mucosa.  The vaginal mucosa was excised off each anterior and posterior section. Excellent hemostasis was obtained with cautery. The mucosa on the lateral sides was closed into a tunnel with a 2-0 vicryl suture.  Serial pursestring sutures were used to imbricate and the cervix and vaginal prolapse using 2-0 vicryl. Anterior and posterior plication sutures were placed to reapproximate the epithelium with 0-vicryl. The mucosa was then closed with a 0-vicryl suture in a running fashion.   Attention was then turned to the perineum. Two allis clamps were placed at the introitus. The perineum was injected with 1% lidocaine  with epinephrine . A diamond shaped incision was made over the perineum and excess skin was removed. Dissection was performed with Metzenbaum scissors to separate the mucosa from the underlying tissue. The levator muscles were approximated to the midline with two interrupted 0-vicryl sutures.  The perineal body was then reapproximated with two interrupted 0-vicryl sutures. The perineal skin was then closed with a 2-0 vicryl in a subcutaneous and running fashion. Irrigation was performed and good hemostasis was noted.   The Foley catheter was removed.  A 70-degree cystoscope was introduced, and 360-degree inspection revealed no trauma or lesions the bladder, with bilateral ureteral efflux.  The bladder was drained and the cystoscope was removed.    The urethral bulking was performed next. A 0 degree Bulkamid cystoscope was inserted into the urethra into the bladder.  The needle was primed.  The cystoscope was inserted to the level of the bladder neck.  The needle was inserted 2 cm and the scope was pulled back into the urethra 2 cm.  The needle was inserted bevel up at the 5 o'clock position and the Bulkamid was injected to obtain coaptation.  This was repeated at the 2 o'clock,  10 o'clock and 7 o'clock positions.   A total of 2- 1ml syringes were used and good circumferential coaptation  was noted.  The cystoscope was removed and a 46F foley catheter was placed.   Hemostasis was again noted. The patient tolerated the procedure well.  She was awakened from anesthesia and transferred to the recovery room in stable condition. All counts were correct x 2.    Arma Lamp, MD

## 2023-11-07 NOTE — Anesthesia Procedure Notes (Addendum)
 Procedure Name: LMA Insertion Date/Time: 11/07/2023 11:33 AM  Performed by: Woodson Macha C, CRNAPre-anesthesia Checklist: Patient identified, Emergency Drugs available, Suction available and Patient being monitored Patient Re-evaluated:Patient Re-evaluated prior to induction Oxygen Delivery Method: Circle System Utilized Preoxygenation: Pre-oxygenation with 100% oxygen Induction Type: IV induction Ventilation: Mask ventilation without difficulty LMA: LMA inserted LMA Size: 3.0 Number of attempts: 1 Airway Equipment and Method: Bite block Placement Confirmation: positive ETCO2 Tube secured with: Tape Dental Injury: Teeth and Oropharynx as per pre-operative assessment

## 2023-11-07 NOTE — Progress Notes (Signed)
 Pt passed the void challenge. She voided several times in PACU and was less than 100 mls on the bladder scan.  MD made aware. Pt went home without a Foley Catheter.

## 2023-11-07 NOTE — Discharge Instructions (Addendum)
 DO NOT TAKE TYLENOL  UNTIL AFTER 3:45 PM   POST OPERATIVE INSTRUCTIONS  General Instructions Recovery (not bed rest) will last approximately 6 weeks Walking is encouraged, but refrain from strenuous exercise/ housework/ heavy lifting. No lifting >10lbs  Nothing in the vagina- NO intercourse, tampons or douching Bathing:  Do not submerge in water  (NO swimming, bath, hot tub, etc) until after your postop visit. You can shower starting the day after surgery.  No driving until you are not taking narcotic pain medicine and until your pain is well enough controlled that you can slam on the breaks or make sudden movements if needed.   Taking your medications Please take your acetaminophen  and ibuprofen  on a schedule for the first 48 hours. Take 600mg  ibuprofen , then take 500mg  acetaminophen  3 hours later, then continue to alternate ibuprofen  and acetaminophen . That way you are taking each type of medication every 6 hours. Take the prescribed narcotic (oxycodone , tramadol , etc) as needed, with a maximum being every 4 hours.  Take a stool softener daily to keep your stools soft and preventing you from straining. If you have diarrhea, you decrease your stool softener. This is explained more below. We have prescribed you Miralax .  Reasons to Call the Nurse (see last page for phone numbers) Heavy Bleeding (changing your pad every 1-2 hours) Persistent nausea/vomiting Fever (100.4 degrees or more) Incision problems (pus or other fluid coming out, redness, warmth, increased pain)  Things to Expect After Surgery Mild to Moderate pain is normal during the first day or two after surgery. If prescribed, take Ibuprofen  or Tylenol  first and use the stronger medicine for "break-through" pain. You can overlap these medicines because they work differently.   Constipation   To Prevent Constipation:  Eat a well-balanced diet including protein, grains, fresh fruit and vegetables.  Drink plenty of fluids. Walk  regularly.  Depending on specific instructions from your physician: take Miralax  daily and additionally you can add a stool softener (colace/ docusate) and fiber supplement. Continue as long as you're on pain medications.   To Treat Constipation:  If you do not have a bowel movement in 2 days after surgery, you can take 2 Tbs of Milk of Magnesia 1-2 times a day until you have a bowel movement. If diarrhea occurs, decrease the amount or stop the laxative. If no results with Milk of Magnesia, you can drink a bottle of magnesium citrate which you can purchase over the counter.  Fatigue:  This is a normal response to surgery and will improve with time.  Plan frequent rest periods throughout the day.  Gas Pain:  This is very common but can also be very painful! Drink warm liquids such as herbal teas, bouillon or soup. Walking will help you pass more gas.  Mylicon or Gas-X can be taken over the counter.  Leaking Urine:  Varying amounts of leakage may occur after surgery.  This should improve with time. Your bladder needs at least 3 months to recover from surgery. If you leak after surgery, be sure to mention this to your doctor at your post-op visit. If you were taking medications for overactive bladder prior to surgery, be sure to restart the medications immediately after surgery.  Incisions: If you have incisions on your abdomen, the skin glue will dissolve on its own over time. It is ok to gently rinse with soap and water  over these incisions but do not scrub.  Catheter Approximately 50% of patients are unable to urinate after surgery and need to  go home with a catheter. This allows your bladder to rest so it can return to full function. If you go home with a catheter, the office will call to set up a voiding trial a few days after surgery. For most patients, by this visit, they are able to urinate on their own. Long term catheter use is rare.   Return to Work  As work demands and recovery times vary  widely, it is hard to predict when you will want to return to work. If you have a desk job with no strenuous physical activity, and if you would like to return sooner than generally recommended, discuss this with your provider or call our office.   Post op concerns  For non-emergent issues, please call the Urogynecology Nurse. Please leave a message and someone will contact you within one business day.  You can also send a message through MyChart.   AFTER HOURS (After 5:00 PM and on weekends):  For urgent matters that cannot wait until the next business day. Call our office [phone] and connect to the doctor on call.  Please reserve this for important issues.   **FOR ANY TRUE EMERGENCY ISSUES CALL 911 OR GO TO THE NEAREST EMERGENCY ROOM.** Please inform our office or the doctor on call of any emergency.     APPOINTMENTS: Call [phone]  Post Anesthesia Home Care Instructions  Activity: Get plenty of rest for the remainder of the day. A responsible adult should stay with you for 24 hours following the procedure.  For the next 24 hours, DO NOT: -Drive a car -Advertising copywriter -Drink alcoholic beverages -Take any medication unless instructed by your physician -Make any legal decisions or sign important papers.  Meals: Start with liquid foods such as gelatin or soup. Progress to regular foods as tolerated. Avoid greasy, spicy, heavy foods. If nausea and/or vomiting occur, drink only clear liquids until the nausea and/or vomiting subsides. Call your physician if vomiting continues.  Special Instructions/Symptoms: Your throat may feel dry or sore from the anesthesia or the breathing tube placed in your throat during surgery. If this causes discomfort, gargle with warm salt water . The discomfort should disappear within 24 hours.  If you had a scopolamine patch placed behind your ear for the management of post- operative nausea and/or vomiting:  1. The medication in the patch is  effective for 72 hours, after which it should be removed.  Wrap patch in a tissue and discard in the trash. Wash hands thoroughly with soap and water . 2. You may remove the patch earlier than 72 hours if you experience unpleasant side effects which may include dry mouth, dizziness or visual disturbances. 3. Avoid touching the patch. Wash your hands with soap and water  after contact with the patch.  Call your surgeon if you experience:   1.  Fever over 101.0. 2.  Inability to urinate. 3.  Nausea and/or vomiting. 4.  Extreme swelling or bruising at the surgical site. 5.  Continued bleeding from the incision. 6.  Increased pain, redness or drainage from the incision. 7.  Problems related to your pain medication. 8. Any change in color, movement and/or sensation 9. Any problems and/or concerns

## 2023-11-07 NOTE — Interval H&P Note (Signed)
 History and Physical Interval Note:  11/07/2023 10:28 AM  Nancy Ibarra  has presented today for surgery, with the diagnosis of uterovaginal prolapse complete; stress urinary incontinence.  The various methods of treatment have been discussed with the patient and family. After consideration of risks, benefits and other options for treatment, the patient has consented to  Procedure(s) with comments: COLPOCLEISIS (N/A) COLPORRHAPHY, POSTERIOR, FOR RECTOCELE REPAIR (N/A) - levator plication PERINEOPLASTY (N/A) CYSTOSCOPY (N/A) INJECTION, BULKING AGENT, URETHRA (N/A) as a surgical intervention.  The patient's history has been reviewed, patient examined, no change in status, stable for surgery.  I have reviewed the patient's chart and labs.  Questions were answered to the patient's satisfaction.     Arma Lamp

## 2023-11-07 NOTE — Anesthesia Preprocedure Evaluation (Addendum)
 Anesthesia Evaluation  Patient identified by MRN, date of birth, ID band Patient awake    Reviewed: Allergy & Precautions, NPO status , Patient's Chart, lab work & pertinent test results  Airway Mallampati: III  TM Distance: >3 FB Neck ROM: Full    Dental no notable dental hx. (+) Teeth Intact, Dental Advisory Given   Pulmonary neg pulmonary ROS   Pulmonary exam normal breath sounds clear to auscultation       Cardiovascular Normal cardiovascular exam+ dysrhythmias Atrial Fibrillation  Rhythm:Regular Rate:Normal  TTE 2024 1. Left ventricular ejection fraction, by estimation, is 55 to 60%. Left  ventricular ejection fraction by 3D volume is 59 %. The left ventricle has  normal function. The left ventricle has no regional wall motion  abnormalities. Left ventricular diastolic   parameters are consistent with Grade I diastolic dysfunction (impaired  relaxation). The average left ventricular global longitudinal strain is  -20.9 %. The global longitudinal strain is normal.   2. Right ventricular systolic function is normal. The right ventricular  size is normal. Tricuspid regurgitation signal is inadequate for assessing  PA pressure.   3. The mitral valve is normal in structure. Trivial mitral valve  regurgitation. No evidence of mitral stenosis.   4. The aortic valve is tricuspid. There is mild thickening of the aortic  valve. Aortic valve regurgitation is trivial. No aortic stenosis is  present.   5. The inferior vena cava is normal in size with greater than 50%  respiratory variability, suggesting right atrial pressure of 3 mmHg.      Neuro/Psych negative neurological ROS  negative psych ROS   GI/Hepatic negative GI ROS, Neg liver ROS,,,  Endo/Other    Class 3 obesity (BMI 40)  Renal/GU negative Renal ROS  negative genitourinary   Musculoskeletal negative musculoskeletal ROS (+)    Abdominal   Peds   Hematology negative hematology ROS (+)   Anesthesia Other Findings   Reproductive/Obstetrics                             Anesthesia Physical Anesthesia Plan  ASA: 3  Anesthesia Plan: General   Post-op Pain Management: Tylenol  PO (pre-op)* and Toradol  IV (intra-op)*   Induction: Intravenous  PONV Risk Score and Plan: 3 and Ondansetron , Dexamethasone  and Treatment may vary due to age or medical condition  Airway Management Planned: LMA  Additional Equipment:   Intra-op Plan:   Post-operative Plan: Extubation in OR  Informed Consent: I have reviewed the patients History and Physical, chart, labs and discussed the procedure including the risks, benefits and alternatives for the proposed anesthesia with the patient or authorized representative who has indicated his/her understanding and acceptance.     Dental advisory given  Plan Discussed with: CRNA  Anesthesia Plan Comments:        Anesthesia Quick Evaluation

## 2023-11-07 NOTE — Anesthesia Postprocedure Evaluation (Signed)
 Anesthesia Post Note  Patient: CANDE MASTROPIETRO  Procedure(s) Performed: COLPOCLEISIS COLPORRHAPHY, POSTERIOR, FOR RECTOCELE REPAIR PERINEOPLASTY CYSTOSCOPY (Bladder) INJECTION, BULKING AGENT, URETHRA EXAM UNDER ANESTHESIA, PELVIC (Vagina )     Patient location during evaluation: PACU Anesthesia Type: General Level of consciousness: awake and alert Pain management: pain level controlled Vital Signs Assessment: post-procedure vital signs reviewed and stable Respiratory status: spontaneous breathing, nonlabored ventilation, respiratory function stable and patient connected to nasal cannula oxygen Cardiovascular status: blood pressure returned to baseline and stable Postop Assessment: no apparent nausea or vomiting Anesthetic complications: no  No notable events documented.  Last Vitals:  Vitals:   11/07/23 1345 11/07/23 1415  BP: (!) 109/48 (!) 113/43  Pulse: (!) 58 61  Resp: 13 10  Temp:    SpO2: 100% 99%    Last Pain:  Vitals:   11/07/23 1415  TempSrc:   PainSc: 0-No pain                 Jocelyn Nold L Jamyria Ozanich

## 2023-11-07 NOTE — Transfer of Care (Signed)
 Immediate Anesthesia Transfer of Care Note  Patient: Nancy Ibarra  Procedure(s) Performed: COLPOCLEISIS COLPORRHAPHY, POSTERIOR, FOR RECTOCELE REPAIR PERINEOPLASTY CYSTOSCOPY (Bladder) INJECTION, BULKING AGENT, URETHRA EXAM UNDER ANESTHESIA, PELVIC (Vagina )  Patient Location: PACU  Anesthesia Type:General  Level of Consciousness: awake and sedated  Airway & Oxygen Therapy: Patient Spontanous Breathing  Post-op Assessment: Report given to RN and Post -op Vital signs reviewed and stable  Post vital signs: Reviewed and stable  Last Vitals:  Vitals Value Taken Time  BP 100/40 11/07/23 1336  Temp    Pulse 59 11/07/23 1339  Resp 13 11/07/23 1339  SpO2 100 % 11/07/23 1339  Vitals shown include unfiled device data.  Last Pain:  Vitals:   11/07/23 0926  TempSrc: Oral  PainSc: 0-No pain      Patients Stated Pain Goal: 7 (11/07/23 0926)  Complications: No notable events documented.

## 2023-11-08 ENCOUNTER — Telehealth: Payer: Self-pay | Admitting: Obstetrics and Gynecology

## 2023-11-08 ENCOUNTER — Encounter (HOSPITAL_COMMUNITY): Payer: Self-pay | Admitting: Obstetrics and Gynecology

## 2023-11-08 ENCOUNTER — Telehealth: Payer: Self-pay

## 2023-11-08 NOTE — Telephone Encounter (Signed)
 Nancy Ibarra  underwent Colpocleisis, Colporrhaphy, Posterior, For Rectocele Repair, Perineoplasty, Cystoscopy, Injection, Bulking Agent, Urethra, and Exam Under Anesthesia, Pelvic  on 11/07/2023  with [x] Dr Frutoso Jing [] Dr Aron Lard.  The patient reports that her pain is controlled.  She is taking [] No Medication [x] Acetaminophen  500mg  every 6 hours [x] Ibuprofen  600mg  every 6 hours or []  Prescribed Narcotic.  Her pain level is 2[x] with medication [] Without medication is.   She denies vaginal bleeding.  The patient is tolerating PO fluids and solids. She has not had a bowel movement and is taking Miralax  for a bowel regimen. She is not passing gas.  She was discharged without a catheter.   [x]  Discharged without a catheter, the patient does feel as if she is emptying her bladder.  [x] Verified scheduled date and time with patient.  She does not having any additional questions.  Reviewed Post operative instructions as needed to answer additional questions.   CC'd note to patient's provider.

## 2023-11-08 NOTE — Telephone Encounter (Signed)
 Nancy Ibarra underwent LeForte Colpocleisis, levator plication, perineorrhaphy, cystoscopy, urethral bulking (Bulkamid ) on 11/07/23.   She passed her voiding trial.  Pt voided multiple times while in pacu. After last void, PVR by bladder scan was  Nurse has spoken to pt for post op  Arma Lamp, MD

## 2023-12-13 ENCOUNTER — Other Ambulatory Visit (HOSPITAL_COMMUNITY)
Admission: RE | Admit: 2023-12-13 | Discharge: 2023-12-13 | Disposition: A | Discharge status: Home / Self Care | Source: Ambulatory Visit | Attending: Obstetrics and Gynecology | Admitting: Obstetrics and Gynecology

## 2023-12-13 ENCOUNTER — Ambulatory Visit (INDEPENDENT_AMBULATORY_CARE_PROVIDER_SITE_OTHER): Admitting: Obstetrics and Gynecology

## 2023-12-13 VITALS — BP 151/75 | HR 67

## 2023-12-13 DIAGNOSIS — Z48816 Encounter for surgical aftercare following surgery on the genitourinary system: Secondary | ICD-10-CM

## 2023-12-13 DIAGNOSIS — N898 Other specified noninflammatory disorders of vagina: Secondary | ICD-10-CM | POA: Insufficient documentation

## 2023-12-13 DIAGNOSIS — Z09 Encounter for follow-up examination after completed treatment for conditions other than malignant neoplasm: Secondary | ICD-10-CM

## 2023-12-13 NOTE — Addendum Note (Signed)
 Addended by: Lu Paradise N on: 12/13/2023 12:16 PM   Modules accepted: Orders

## 2023-12-13 NOTE — Progress Notes (Signed)
 [facility] Return Visit  SUBJECTIVE  History of Present Illness: Nancy Ibarra is a 73 y.o. female who had surgery with Dr. Frutoso Jing on 11/07/23.   Surgical procedure:  Exam under anesthesia, LeForte Colpocleisis, levator plication and perineorrhaphy, urethral bulking, cystoscopy   Patient was discharged without a catheter.   Patient reports she has been having appropriate urination with no incontinence. Endorses better bowel movements. She reports she has seen some minimal bleeding and has a burning/irritation on her left side peri-urethral.   Denies large clots. Denies any other concerns.   Past Medical History: Patient  has a past medical history of Atrial fibrillation (HCC), Breast cancer (HCC) (2000), Dysrhythmia, History of kidney stones, Personal history of chemotherapy (2000), and Umbilical hernia.   Past Surgical History: She  has a past surgical history that includes Mastectomy (Left, 2000); Breast biopsy (Right, 2014); Breast biopsy (Right, 2018); Extracorporeal shock wave lithotripsy (Right, 08/25/2017); Ovarian cyst removal; Colpocleisis (N/A, 11/07/2023); Rectocele repair (N/A, 11/07/2023); Perineoplasty (N/A, 11/07/2023); Cystoscopy (N/A, 11/07/2023); Injection, bulking agent, urethra (N/A, 11/07/2023); and Exam under anesthesia, pelvic (11/07/2023).   Medications: She has a current medication list which includes the following prescription(s): acetaminophen , estradiol , ibuprofen , multivitamin with minerals, naproxen sodium, polyethylene glycol powder, and potassium chloride .   Allergies: Patient is allergic to oxybutynin, oxycodone , and sulfa antibiotics.   Social History: Patient  reports that she has never smoked. She has never used smokeless tobacco. She reports that she does not drink alcohol and does not use drugs.     OBJECTIVE     Physical Exam: Vitals:   12/13/23 1130  BP: (!) 151/75  Pulse: 67   Gen: No apparent distress, A&O x 3.  Detailed  Urogynecologic Evaluation:  Patient has a small area of irritation on the left peri-urethral area that has some oozing blood. Her sutures are healing well and she has no other active bleeding noted. Mild white discharge noted in vaginal vault. Aptima swab obtained.  Peri stitches are still dissolving at this time.     ASSESSMENT AND PLAN    Nancy Ibarra is a 73 y.o. with:  1. Postop check    Patient had a small area of irritation. A small amount of estrogen cream was placed on the area. Patient reported she had been washing with Dial soap. We discussed this can be drying and to consider using cetaphil or dove around the vaginal lips to reduce drying. Patient instructed to use estrogen cream around the urethra and at the peri area. Aptima swab to rule out yeast or BV.   Patient to return for regular post op check in one week with Dr. Frutoso Jing.     Ileta Ofarrell G Eddison Searls, NP

## 2023-12-14 ENCOUNTER — Ambulatory Visit: Payer: Self-pay | Admitting: Obstetrics and Gynecology

## 2023-12-14 LAB — CERVICOVAGINAL ANCILLARY ONLY
Bacterial Vaginitis (gardnerella): NEGATIVE
Candida Glabrata: NEGATIVE
Candida Vaginitis: NEGATIVE
Comment: NEGATIVE
Comment: NEGATIVE
Comment: NEGATIVE

## 2023-12-20 ENCOUNTER — Encounter: Payer: Self-pay | Admitting: Obstetrics and Gynecology

## 2023-12-20 ENCOUNTER — Ambulatory Visit (INDEPENDENT_AMBULATORY_CARE_PROVIDER_SITE_OTHER): Admitting: Obstetrics and Gynecology

## 2023-12-20 ENCOUNTER — Other Ambulatory Visit (HOSPITAL_COMMUNITY)
Admission: RE | Admit: 2023-12-20 | Discharge: 2023-12-20 | Disposition: A | Discharge status: Home / Self Care | Source: Ambulatory Visit | Attending: Obstetrics and Gynecology | Admitting: Obstetrics and Gynecology

## 2023-12-20 VITALS — BP 157/79 | HR 60

## 2023-12-20 DIAGNOSIS — Z48816 Encounter for surgical aftercare following surgery on the genitourinary system: Secondary | ICD-10-CM

## 2023-12-20 DIAGNOSIS — N898 Other specified noninflammatory disorders of vagina: Secondary | ICD-10-CM

## 2023-12-20 NOTE — Progress Notes (Signed)
 [facility]  Date of Visit: 12/20/2023  History of Present Illness: Ms. Nancy Ibarra is a 73 y.o. female scheduled today for a post-operative visit.   Surgery: s/p Exam under anesthesia, LeForte Colpocleisis, levator plication and perineorrhaphy, urethral bulking, cystoscopy on 12/13/23  She passed her postoperative void trial.   Postoperative course has been uncomplicated.   She presented a few weeks ago for vaginal bleeding/ irritation and was noted to have an area in the periurethra with some irritation. She started the estrogen cream and has noticed a great improvement. Starting to feel better, can sit comfortably.   UTI in the last 6 weeks? No  Pain? No  She has returned to her normal activity (except for postop restrictions) Vaginal bulge? No  Stress incontinence: No  Urgency/frequency: No  Urge incontinence: Yes , only if she waits too long and might have a drop of urine Voiding dysfunction: No  Bowel issues: No   Subjective Success: Do you usually have a bulge or something falling out that you can see or feel in the vaginal area? No  Retreatment Success: Any retreatment with surgery or pessary for any compartment? No    Medications: She has a current medication list which includes the following prescription(s): acetaminophen , estradiol , ibuprofen , multivitamin with minerals, naproxen sodium, polyethylene glycol powder, and potassium chloride .   Allergies: Patient is allergic to oxybutynin, oxycodone , and sulfa antibiotics.   Physical Exam: BP (!) 157/79 (BP Location: Right Arm, Patient Position: Sitting, Cuff Size: Normal)   Pulse 60    Pelvic Examination: Vagina: Incisions healing well. Sutures are present at the perineum, slight separation and discharge present. Aptima obtained. Sutures present at cuff, no granulation tissue present. No tenderness along the anterior or posterior vagina.   POP-Q: POP-Q  -2                                            Aa   -2                                            Ba  -2                                              C   2                                            Gh  3.5                                            Pb  2                                            tvl   -2  Ap  -2                                            Bp                                                 D    ---------------------------------------------------------  Assessment and Plan:  1. Vaginal discharge     - Healing well overall but some delayed healing at perineum. Continue to use estrace  cream on perineum and vagina twice a week. Will reexamine in a month. Aptima swab obtained due to discharge.   - Can resume regular activity including exercise. Discussed avoidance of heavy lifting and straining long term to reduce the risk of recurrence.   All questions answered.   Return in about 1 month (around 01/19/2024).  Nancy Lamp, MD

## 2023-12-22 ENCOUNTER — Ambulatory Visit: Payer: Self-pay | Admitting: Obstetrics

## 2023-12-22 LAB — CERVICOVAGINAL ANCILLARY ONLY
Bacterial Vaginitis (gardnerella): NEGATIVE
Candida Glabrata: NEGATIVE
Candida Vaginitis: NEGATIVE
Comment: NEGATIVE
Comment: NEGATIVE
Comment: NEGATIVE

## 2024-01-10 DIAGNOSIS — H53143 Visual discomfort, bilateral: Secondary | ICD-10-CM | POA: Diagnosis not present

## 2024-01-17 ENCOUNTER — Encounter: Payer: Self-pay | Admitting: Obstetrics and Gynecology

## 2024-01-17 ENCOUNTER — Ambulatory Visit (INDEPENDENT_AMBULATORY_CARE_PROVIDER_SITE_OTHER): Admitting: Obstetrics and Gynecology

## 2024-01-17 VITALS — BP 148/79 | HR 73

## 2024-01-17 DIAGNOSIS — Z9889 Other specified postprocedural states: Secondary | ICD-10-CM

## 2024-01-17 DIAGNOSIS — Z48816 Encounter for surgical aftercare following surgery on the genitourinary system: Secondary | ICD-10-CM

## 2024-01-17 NOTE — Patient Instructions (Signed)
 Vulvovaginal moisturizer Options: Vitamin E oil (pump or capsule) or cream (Gene's Vit E Cream) Coconut oil V-magic (on guam) Crisco Consider the ingredients of the product - the fewer the ingredients the better!  Directions for Use: Clean and dry your hands Gently dab the vulvar/vaginal area dry as needed Apply a "pea-sized" amount of the moisturizer onto your fingertip Using you other hand, open the labia  Apply the moisturizer to the vulvar/vaginal tissues Wear loose fitting underwear/clothing if possible following application Use moisturize up to 3 times daily as desired.    Can also continue vaginal estrogen twice a week for vaginal dryness.

## 2024-01-17 NOTE — Progress Notes (Signed)
 [facility]  Date of Visit: 01/17/2024  History of Present Illness: Ms. Group is a 73 y.o. female scheduled today for a post-operative visit.   Surgery: s/p Exam under anesthesia, LeForte Colpocleisis, levator plication and perineorrhaphy, urethral bulking, cystoscopy on 12/13/23  She is feeling well, no complaints. Denies any urinary leakage. Has been using vaginal estrogen cream. Has some irritation with wiping.   Medications: She has a current medication list which includes the following prescription(s): acetaminophen , estradiol , ibuprofen , multivitamin with minerals, naproxen sodium, polyethylene glycol powder, and potassium chloride .   Allergies: Patient is allergic to oxybutynin, oxycodone , and sulfa antibiotics.   Physical Exam: BP (!) 148/79   Pulse 73    Pelvic Examination: Perineum well healed. One small suture remaining at vaginal cuff but otherwise well healed.    ---------------------------------------------------------  Assessment and Plan:  1. Post-operative state     - Well healed.  - Discussed avoidance of heavy lifting and straining long term to reduce the risk of recurrence.  - We discussed that urethral bulking may become less effective over time.  - Can continue vaginal estrogen cream for dryness. Discussed also using a vaginal moisturizer like coconut oil to help reduce irritation with wiping.   Follow up as needed  Rosaline LOISE Caper, MD

## 2024-06-30 ENCOUNTER — Other Ambulatory Visit (HOSPITAL_BASED_OUTPATIENT_CLINIC_OR_DEPARTMENT_OTHER): Payer: Self-pay | Admitting: Family Medicine

## 2024-06-30 DIAGNOSIS — Z780 Asymptomatic menopausal state: Secondary | ICD-10-CM

## 2024-07-30 ENCOUNTER — Encounter: Payer: Self-pay | Admitting: *Deleted
